# Patient Record
Sex: Female | Born: 1940 | Race: White | Hispanic: No | Marital: Single | Smoking: Former smoker
Health system: Southern US, Community
[De-identification: ages and names within clinical notes are randomized; demographics above are authoritative.]

## PROBLEM LIST (undated history)

## (undated) DIAGNOSIS — G309 Alzheimer's disease, unspecified: Secondary | ICD-10-CM

## (undated) DIAGNOSIS — Z1211 Encounter for screening for malignant neoplasm of colon: Secondary | ICD-10-CM

## (undated) DIAGNOSIS — R933 Abnormal findings on diagnostic imaging of other parts of digestive tract: Secondary | ICD-10-CM

## (undated) DIAGNOSIS — F209 Schizophrenia, unspecified: Secondary | ICD-10-CM

## (undated) DIAGNOSIS — J9601 Acute respiratory failure with hypoxia: Secondary | ICD-10-CM

## (undated) DIAGNOSIS — F0280 Dementia in other diseases classified elsewhere without behavioral disturbance: Secondary | ICD-10-CM

## (undated) DIAGNOSIS — Z860100 Personal history of colon polyps, unspecified: Secondary | ICD-10-CM

## (undated) DIAGNOSIS — N390 Urinary tract infection, site not specified: Secondary | ICD-10-CM

## (undated) DIAGNOSIS — K5730 Diverticulosis of large intestine without perforation or abscess without bleeding: Secondary | ICD-10-CM

## (undated) DIAGNOSIS — F429 Obsessive-compulsive disorder, unspecified: Secondary | ICD-10-CM

## (undated) DIAGNOSIS — J189 Pneumonia, unspecified organism: Secondary | ICD-10-CM

## (undated) DIAGNOSIS — S7290XA Unspecified fracture of unspecified femur, initial encounter for closed fracture: Secondary | ICD-10-CM

## (undated) DIAGNOSIS — D649 Anemia, unspecified: Secondary | ICD-10-CM

## (undated) DIAGNOSIS — E785 Hyperlipidemia, unspecified: Secondary | ICD-10-CM

## (undated) DIAGNOSIS — F331 Major depressive disorder, recurrent, moderate: Secondary | ICD-10-CM

## (undated) DIAGNOSIS — R531 Weakness: Secondary | ICD-10-CM

## (undated) DIAGNOSIS — K219 Gastro-esophageal reflux disease without esophagitis: Secondary | ICD-10-CM

## (undated) DIAGNOSIS — E039 Hypothyroidism, unspecified: Secondary | ICD-10-CM

## (undated) DIAGNOSIS — K623 Rectal prolapse: Secondary | ICD-10-CM

## (undated) DIAGNOSIS — R635 Abnormal weight gain: Secondary | ICD-10-CM

## (undated) DIAGNOSIS — R32 Unspecified urinary incontinence: Secondary | ICD-10-CM

## (undated) DIAGNOSIS — E079 Disorder of thyroid, unspecified: Secondary | ICD-10-CM

## (undated) HISTORY — DX: Major depressive disorder, recurrent, moderate: F33.1

## (undated) HISTORY — DX: Encounter for screening for malignant neoplasm of colon: Z12.11

## (undated) HISTORY — DX: Anemia, unspecified: D64.9

## (undated) HISTORY — PX: JOINT REPLACEMENT: SHX530

## (undated) HISTORY — PX: HEMIARTHROPLASTY HIP: SUR652

## (undated) HISTORY — DX: Disorder of thyroid, unspecified: E07.9

## (undated) HISTORY — DX: Personal history of colon polyps, unspecified: Z86.0100

## (undated) HISTORY — DX: Alzheimer's disease, unspecified: G30.9

## (undated) HISTORY — DX: Rectal prolapse: K62.3

## (undated) HISTORY — DX: Pneumonia, unspecified organism: J18.9

## (undated) HISTORY — DX: Schizophrenia, unspecified: F20.9

## (undated) HISTORY — DX: Abnormal findings on diagnostic imaging of other parts of digestive tract: R93.3

## (undated) HISTORY — PX: OTHER SURGICAL HISTORY: SHX169

## (undated) HISTORY — DX: Hyperlipidemia, unspecified: E78.5

## (undated) HISTORY — DX: Acute respiratory failure with hypoxia: J96.01

## (undated) HISTORY — DX: Dementia in other diseases classified elsewhere without behavioral disturbance: F02.80

## (undated) HISTORY — DX: Weakness: R53.1

## (undated) HISTORY — DX: Dementia in other diseases classified elsewhere, unspecified severity, without behavioral disturbance, psychotic disturbance, mood disturbance, and anxiety: F02.80

## (undated) HISTORY — DX: Personal history of colonic polyps: Z86.010

## (undated) HISTORY — DX: Unspecified fracture of unspecified femur, initial encounter for closed fracture: S72.90XA

## (undated) HISTORY — DX: Abnormal weight gain: R63.5

## (undated) HISTORY — DX: Obsessive-compulsive disorder, unspecified: F42.9

## (undated) HISTORY — PX: ORIF FEMUR FRACTURE- LISS PLATE: SHX2120

## (undated) HISTORY — DX: Diverticulosis of large intestine without perforation or abscess without bleeding: K57.30

## (undated) HISTORY — DX: Hypothyroidism, unspecified: E03.9

## (undated) HISTORY — DX: Gastro-esophageal reflux disease without esophagitis: K21.9

## (undated) HISTORY — DX: Unspecified urinary incontinence: R32

## (undated) HISTORY — DX: Urinary tract infection, site not specified: N39.0

---

## 1998-06-03 ENCOUNTER — Emergency Department (HOSPITAL_COMMUNITY): Admission: EM | Admit: 1998-06-03 | Discharge: 1998-06-03 | Payer: Self-pay | Admitting: Emergency Medicine

## 1998-10-31 ENCOUNTER — Emergency Department (HOSPITAL_COMMUNITY): Admission: EM | Admit: 1998-10-31 | Discharge: 1998-10-31 | Payer: Self-pay | Admitting: Emergency Medicine

## 2004-07-01 ENCOUNTER — Encounter: Admission: RE | Admit: 2004-07-01 | Discharge: 2004-07-01 | Payer: Self-pay | Admitting: Internal Medicine

## 2005-08-13 ENCOUNTER — Encounter: Admission: RE | Admit: 2005-08-13 | Discharge: 2005-08-13 | Payer: Self-pay | Admitting: *Deleted

## 2006-06-20 ENCOUNTER — Emergency Department (HOSPITAL_COMMUNITY): Admission: EM | Admit: 2006-06-20 | Discharge: 2006-06-20 | Payer: Self-pay | Admitting: Emergency Medicine

## 2006-08-19 ENCOUNTER — Encounter: Admission: RE | Admit: 2006-08-19 | Discharge: 2006-08-19 | Payer: Self-pay | Admitting: Internal Medicine

## 2006-09-03 ENCOUNTER — Encounter: Admission: RE | Admit: 2006-09-03 | Discharge: 2006-09-03 | Payer: Self-pay | Admitting: Obstetrics and Gynecology

## 2006-12-01 ENCOUNTER — Inpatient Hospital Stay (HOSPITAL_COMMUNITY): Admission: EM | Admit: 2006-12-01 | Discharge: 2006-12-10 | Payer: Self-pay | Admitting: Emergency Medicine

## 2006-12-01 ENCOUNTER — Ambulatory Visit: Payer: Self-pay | Admitting: Internal Medicine

## 2006-12-08 ENCOUNTER — Encounter (INDEPENDENT_AMBULATORY_CARE_PROVIDER_SITE_OTHER): Payer: Self-pay | Admitting: *Deleted

## 2007-03-08 ENCOUNTER — Encounter (INDEPENDENT_AMBULATORY_CARE_PROVIDER_SITE_OTHER): Payer: Self-pay | Admitting: Obstetrics and Gynecology

## 2007-03-08 ENCOUNTER — Ambulatory Visit (HOSPITAL_COMMUNITY): Admission: RE | Admit: 2007-03-08 | Discharge: 2007-03-08 | Payer: Self-pay | Admitting: Obstetrics and Gynecology

## 2007-07-15 DIAGNOSIS — S7290XA Unspecified fracture of unspecified femur, initial encounter for closed fracture: Secondary | ICD-10-CM

## 2007-07-15 HISTORY — DX: Unspecified fracture of unspecified femur, initial encounter for closed fracture: S72.90XA

## 2007-09-24 ENCOUNTER — Inpatient Hospital Stay (HOSPITAL_COMMUNITY): Admission: EM | Admit: 2007-09-24 | Discharge: 2007-09-29 | Payer: Self-pay | Admitting: Emergency Medicine

## 2007-09-26 ENCOUNTER — Encounter (INDEPENDENT_AMBULATORY_CARE_PROVIDER_SITE_OTHER): Payer: Self-pay | Admitting: *Deleted

## 2007-10-29 ENCOUNTER — Encounter (HOSPITAL_BASED_OUTPATIENT_CLINIC_OR_DEPARTMENT_OTHER): Admission: RE | Admit: 2007-10-29 | Discharge: 2008-01-27 | Payer: Self-pay | Admitting: Surgery

## 2008-11-28 ENCOUNTER — Inpatient Hospital Stay (HOSPITAL_COMMUNITY): Admission: EM | Admit: 2008-11-28 | Discharge: 2008-12-05 | Payer: Self-pay | Admitting: Emergency Medicine

## 2008-11-28 ENCOUNTER — Ambulatory Visit: Payer: Self-pay | Admitting: Internal Medicine

## 2008-12-01 ENCOUNTER — Encounter (INDEPENDENT_AMBULATORY_CARE_PROVIDER_SITE_OTHER): Payer: Self-pay | Admitting: Internal Medicine

## 2009-10-02 ENCOUNTER — Encounter: Admission: RE | Admit: 2009-10-02 | Discharge: 2009-10-02 | Payer: Self-pay | Admitting: Geriatric Medicine

## 2009-11-16 ENCOUNTER — Emergency Department (HOSPITAL_COMMUNITY): Admission: EM | Admit: 2009-11-16 | Discharge: 2009-11-16 | Payer: Self-pay | Admitting: Emergency Medicine

## 2010-02-19 ENCOUNTER — Encounter: Payer: Self-pay | Admitting: Internal Medicine

## 2010-03-14 ENCOUNTER — Encounter (INDEPENDENT_AMBULATORY_CARE_PROVIDER_SITE_OTHER): Payer: Self-pay | Admitting: *Deleted

## 2010-04-08 ENCOUNTER — Encounter: Payer: Self-pay | Admitting: Internal Medicine

## 2010-05-06 DIAGNOSIS — F0280 Dementia in other diseases classified elsewhere without behavioral disturbance: Secondary | ICD-10-CM

## 2010-05-06 DIAGNOSIS — D649 Anemia, unspecified: Secondary | ICD-10-CM | POA: Insufficient documentation

## 2010-05-06 DIAGNOSIS — M810 Age-related osteoporosis without current pathological fracture: Secondary | ICD-10-CM | POA: Insufficient documentation

## 2010-05-06 DIAGNOSIS — G309 Alzheimer's disease, unspecified: Secondary | ICD-10-CM

## 2010-05-06 DIAGNOSIS — F331 Major depressive disorder, recurrent, moderate: Secondary | ICD-10-CM | POA: Insufficient documentation

## 2010-05-06 DIAGNOSIS — F209 Schizophrenia, unspecified: Secondary | ICD-10-CM

## 2010-05-06 DIAGNOSIS — K219 Gastro-esophageal reflux disease without esophagitis: Secondary | ICD-10-CM | POA: Insufficient documentation

## 2010-05-06 DIAGNOSIS — F429 Obsessive-compulsive disorder, unspecified: Secondary | ICD-10-CM | POA: Insufficient documentation

## 2010-05-06 HISTORY — DX: Dementia in other diseases classified elsewhere, unspecified severity, without behavioral disturbance, psychotic disturbance, mood disturbance, and anxiety: F02.80

## 2010-05-06 HISTORY — DX: Schizophrenia, unspecified: F20.9

## 2010-05-10 ENCOUNTER — Ambulatory Visit: Payer: Self-pay | Admitting: Internal Medicine

## 2010-05-27 ENCOUNTER — Telehealth: Payer: Self-pay | Admitting: Internal Medicine

## 2010-05-28 ENCOUNTER — Ambulatory Visit: Payer: Self-pay | Admitting: Internal Medicine

## 2010-05-28 ENCOUNTER — Inpatient Hospital Stay (HOSPITAL_COMMUNITY)
Admission: AD | Admit: 2010-05-28 | Discharge: 2010-05-30 | Payer: Self-pay | Source: Home / Self Care | Admitting: Internal Medicine

## 2010-05-29 ENCOUNTER — Encounter: Payer: Self-pay | Admitting: Internal Medicine

## 2010-05-30 ENCOUNTER — Encounter: Payer: Self-pay | Admitting: Internal Medicine

## 2010-05-31 ENCOUNTER — Telehealth (INDEPENDENT_AMBULATORY_CARE_PROVIDER_SITE_OTHER): Payer: Self-pay | Admitting: *Deleted

## 2010-05-31 ENCOUNTER — Telehealth: Payer: Self-pay | Admitting: Internal Medicine

## 2010-06-16 IMAGING — CR DG CHEST 2V
1 series · 1 of 1 positions shown · non-contrast
Comparison: Chest x-ray of 10/02/2009

CLINICAL DATA: Fell with left leg pain, shortness of breath, former
smoker

CHEST - 2 VIEW

[view not recorded]
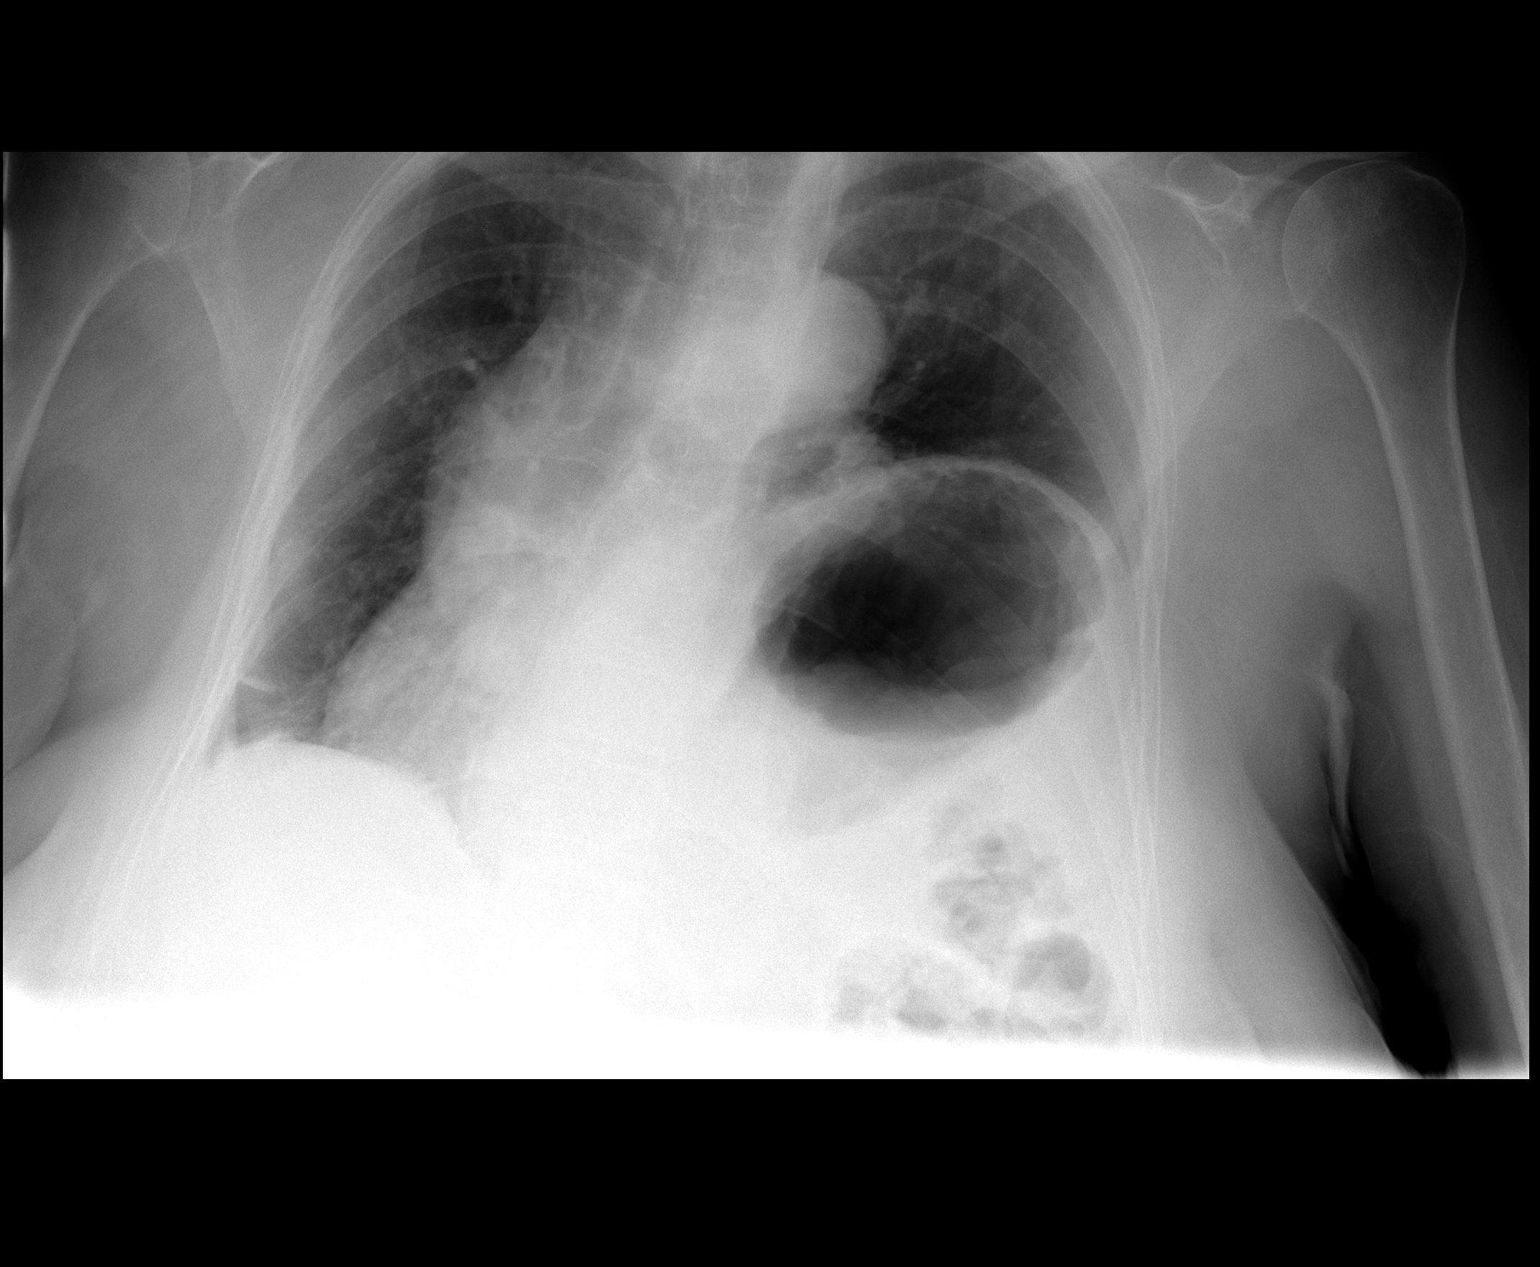

[1 of 1 positions shown; findings below may reference images not displayed]

FINDINGS: There is little change in chronic elevation of the left
hemidiaphragm.  Otherwise the lungs are clear.  Cardiomegaly is
stable.  No acute bony abnormality is seen.
IMPRESSION: Stable chest x-ray with chronic elevation of the left
hemidiaphragm and cardiomegaly.  No active process.

## 2010-07-16 ENCOUNTER — Ambulatory Visit
Admission: RE | Admit: 2010-07-16 | Discharge: 2010-07-16 | Payer: Self-pay | Source: Home / Self Care | Attending: Internal Medicine | Admitting: Internal Medicine

## 2010-07-16 DIAGNOSIS — E039 Hypothyroidism, unspecified: Secondary | ICD-10-CM

## 2010-07-16 DIAGNOSIS — Z860100 Personal history of colon polyps, unspecified: Secondary | ICD-10-CM | POA: Insufficient documentation

## 2010-07-16 DIAGNOSIS — K5730 Diverticulosis of large intestine without perforation or abscess without bleeding: Secondary | ICD-10-CM | POA: Insufficient documentation

## 2010-07-16 HISTORY — DX: Hypothyroidism, unspecified: E03.9

## 2010-07-17 ENCOUNTER — Encounter (INDEPENDENT_AMBULATORY_CARE_PROVIDER_SITE_OTHER): Payer: Self-pay | Admitting: *Deleted

## 2010-07-17 DIAGNOSIS — R933 Abnormal findings on diagnostic imaging of other parts of digestive tract: Secondary | ICD-10-CM

## 2010-07-17 HISTORY — DX: Abnormal findings on diagnostic imaging of other parts of digestive tract: R93.3

## 2010-08-04 ENCOUNTER — Encounter: Payer: Self-pay | Admitting: Obstetrics and Gynecology

## 2010-08-13 NOTE — Procedures (Signed)
Summary: Colonoscopy  Patient: Crystal Braun Note: All result statuses are Final unless otherwise noted.  Tests: (1) Colonoscopy (COL)   COL Colonoscopy           DONE     [facility]     [address] [place], [place]  [address]           COLONOSCOPY PROCEDURE REPORT           PATIENT:  [name], [name]  MR#:  [id number]     BIRTHDATE:  01-11-1941, 69 yrs. old  GENDER:  female     ENDOSCOPIST:  Hedwig Morton. Juanda Chance, MD     REF. BY:  Florentina Jenny, M.D.     PROCEDURE DATE:  05/29/2010     PROCEDURE:  Colonoscopy with biopsy and snare polypectomy     ASA CLASS:  Class III     INDICATIONS:  change in bowel habits, constipation     MEDICATIONS:   Versed 5 mg, Fentanyl 75 mcg           DESCRIPTION OF PROCEDURE:   After the risks benefits and     alternatives of the procedure were thoroughly explained, informed     consent was obtained.  Digital rectal exam was performed and     revealed no abnormalities.   The Pentax Ped Colon [id number]     endoscope was introduced through the anus and advanced to the     cecum, which was identified by both the appendix and ileocecal     valve, without limitations.  The quality of the prep was     excellent, using MoviPrep.  The instrument was then slowly     withdrawn as the colon was fully examined.     <<PROCEDUREIMAGES>>           FINDINGS:  There were multiple polyps identified and removed.     large multilobulated carpeted polyp in the cecum about 3 cm in     diameter, 4 polyps 5-15 mm at 70-8-cm, 2 polypd at right colon The     polyps were removed using cold biopsy forceps. Polyps were snared,     then cauterized with monopolar cautery. Retrieval was successful.     snare polyp Polyps were snared without cautery. Retrieval was     successful (see image2, image1, image5, image6, image7, image8,     and image9). snare polyp  Mild diverticulosis was found in the     sigmoid to descending colon segments (see image1).  This was  otherwise a normal examination of the colon (see image3, image4,     and image10).   Retroflexed views in the rectum revealed no     abnormalities.    The scope was then withdrawn from the patient     and the procedure completed.           COMPLICATIONS:  None     ENDOSCOPIC IMPRESSION:     1) Polyps, multiple     2) Mild diverticulosis in the sigmoid to descending colon     segments     3) Otherwise normal examination     large multilobulated polyp removed piecemeal from the cecum, r/o     dysplasia/carcinoma     RECOMMENDATIONS:     1) Await pathology results     CT scan of the abd and pelvis, CEA, see orders     REPEAT EXAM:  In 1 year(s) for.  ______________________________     Hedwig Morton. Juanda Chance, MD           CC:           n.     eSIGNED:   Hedwig Morton. Rylin Saez at 05/29/2010 11:17 AM           Fredda Hammed, [id number]  Note: An exclamation mark (!) indicates a result that was not dispersed into the flowsheet. Document Creation Date: 05/29/2010 11:18 AM _______________________________________________________________________  (1) Order result status: Final Collection or observation date-time: 05/29/2010 11:09 Requested date-time:  Receipt date-time:  Reported date-time:  Referring Physician:   Ordering Physician: Lina Sar [phone]) Specimen Source:  Source: Launa Grill Order Number: [id number] Lab site:

## 2010-08-13 NOTE — Progress Notes (Signed)
Summary: med ?  Phone Note Call from Patient Call back at Home Phone [phone]   Caller: sister, Jonny Ruiz Call For: Dr. Juanda Chance Reason for Call: Talk to Nurse Summary of Call: when is pt cleared to have asprin Initial call taken by: Vallarie Mare,  May 31, 2010 11:25 AM  Follow-up for Phone Call        Message left for patient to call back.Jesse Fall RN  May 31, 2010 11:41 AM Spoke with [name]. Told her that the hospital d/c states for patient to avoid Aspirin and Nonsteriodal antiinflammatories(Ibuprofren, Advil, Aleve, etc) She wanted to know what patient could take for pain in her broken leg. Suggested Tylenol. Follow-up by: Jesse Fall RN,  May 31, 2010 2:20 PM  Additional Follow-up for Phone Call Additional follow up Details #1::        reviewed and agree. Additional Follow-up by: Hart Carwin MD,  May 31, 2010 10:13 PM

## 2010-08-13 NOTE — Miscellaneous (Signed)
Summary: Visit/[facility]  [facility]   Imported By: Lester Brookridge 05/14/2010 08:54:47  _____________________________________________________________________  External Attachment:    Type:   Image     Comment:   External Document

## 2010-08-13 NOTE — Letter (Signed)
Summary: Letter to Dr Juanda Chance from [facility]  Letter to Dr Juanda Chance from [facility]   Imported By: Lamona Curl CMA (AAMA) 05/06/2010 15:31:02  _____________________________________________________________________  External Attachment:    Type:   Image     Comment:   External Document

## 2010-08-13 NOTE — Letter (Signed)
Summary: Patient Notice- Polyp Results  [facility]  [address] [place], [place] [address]   Phone: [phone]  Fax: [phone]        May 30, 2010 MRN: [id number]    Crystal Braun [address] [place], [place]  [address]    Dear 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  Should you develop new or worsening symptoms of abdominal pain, bowel habit changes or bleeding from the rectum or bowels, please schedule an evaluation with either your primary care physician or with me.  Additional information/recommendations:  __ No further action with gastroenterology is needed at this time. Please      follow-up with your primary care physician for your other healthcare      needs.  _x_ Please call [phone] to schedule a return visit to review your      situation.  __ Please keep your follow-up visit as already scheduled.  __ Continue treatment plan as outlined the day of your exam.  Please call us if you are having persistent problems or have questions about your condition that have not been fully answered at this time.  Sincerely,  Hart Carwin MD  This letter has been electronically signed by your physician.  Appended Document: Patient Notice- Polyp Results Letter mailed to patient's home.

## 2010-08-13 NOTE — Assessment & Plan Note (Signed)
Summary: EVALUATE FOR COLON/YF   History of Present Illness Visit Type: consult  Primary GI MD: Lina Sar MD Primary Provider: Florentina Jenny, MD  Requesting Provider: Florentina Jenny, MD  Chief Complaint: Constipation and diarrhea  History of Present Illness:   This is a 70 year old white female with Alzheimers disease living in an assisted living situation. She is here today for evaluation of irregular bowel habits including diarrhea alternating with constipation. She is brought by her sister and a Comptroller. who give pertinent history. Patient has not been able to take care of her personal needs. It has been noted that she had a fecal impaction about 3 months ago requiring manual disimpaction. She has been increasingly sedentary only occasionally ambulating and this has contributed to the constipation. Besides that, she has been on sertraline and Seroquel. There was no blood noted per rectum. Her weight has been steadily increasing. Her appetite has been good and there has been no nausea, vomiting or abdominal pain. She has been on a laxative regimen includes fiber tablets daily, Senokot 2 tablets at bedtime, and MiraLax 17 g daily which was discontinued 3 weeks ago. She is now on MiraLax p.r.n. but really has not taken any in the last 3 weeks. There is no family history of colon cancer. She has never had a colonoscopy.   GI Review of Systems      Denies abdominal pain, acid reflux, belching, bloating, chest pain, dysphagia with liquids, dysphagia with solids, heartburn, loss of appetite, nausea, vomiting, vomiting blood, weight loss, and  weight gain.      Reports constipation and  diarrhea.     Denies anal fissure, black tarry stools, change in bowel habit, diverticulosis, fecal incontinence, heme positive stool, hemorrhoids, irritable bowel syndrome, jaundice, light color stool, liver problems, rectal bleeding, and  rectal pain.    Current Medications (verified): 1)  Fosamax 70 Mg Tabs  (Alendronate Sodium) .... Take 1 Tablet By Mouth Once Per Week 2)  Just Tears Eye Drops  Soln (Artificial Tear Solution) .... Instill 1 Drop in Both Eyes Three Times A Day 3)  Aspirin 81 Mg Tbec (Aspirin) .... Take 1 Tablet By Mouth Once Daily 4)  Cranberry 250 Mg Caps (Cranberry) .... Take 1 Capsule By Mouth Two Times A Day 5)  Hydrocortisone 1 % Crea (Hydrocortisone) .... Apply To Face Two Times A Day 6)  Imodium A-D 2 Mg Tabs (Loperamide Hcl) .... Take 1 Capsule By Mouth As Directed 7)  Namenda 10 Mg Tabs (Memantine Hcl) .... Take 1 Tablet By Mouth Two Times A Day 8)  Nystatin  Powd (Nystatin) .... Apply Two Times A Day To Vaginal Area Until Symptoms Resolve, Then Apply As Needed 9)  Protonix 40 Mg Solr (Pantoprazole Sodium) .... Take 1 Tablet By Mouth Once A Day 10)  Psyllium 500 Mg Capsule .... Take 1 Capsule By Mouth At Bedtime With Water 11)  Senna 8.6 Mg Tabs (Sennosides) .... Take 2 Tablets By Mouth At Bedtime 12)  Seroquel 50 Mg Tabs (Quetiapine Fumarate) .... Take 1 Tablet By Mouth Once Daily 13)  Sertraline Hcl 50 Mg Tabs (Sertraline Hcl) .... Take 1 Tablet By Mouth Once A Day 14)  Tylenol Pm Extra Strength 500-25 Mg Tabs (Diphenhydramine-Apap (Sleep)) .... Take 1 Tablet By Mouth Every 8 Hours As Needed 15)  Zinc Oxide 20 % Oint (Zinc Oxide) .... Apply Topically After Each Loose Stool 16)  Vitamin D 1000 Unit Tabs (Cholecalciferol) .... One Tablet By Mouth Once Daily  Allergies (verified):  1)  ! 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 Loupe electrical surgical excision of the cervix Left Romilda Joy hemiarthroplasty, left hip Open reduction and internal  fixation of femur fracture with a plate up around the periprosthetic stem.  Family History: Reviewed history from 05/06/2010 and no changes required. 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  Patient is a former smoker -stopped in 1998 Illicit Drug Use - no Patient does not get regular exercise.   Review of Systems  The patient denies allergy/sinus, anemia, anxiety-new, arthritis/joint pain, back pain, blood in urine, breast changes/lumps, change in vision, confusion, cough, coughing up blood, depression-new, fainting, fatigue, fever, headaches-new, hearing problems, heart murmur, heart rhythm changes, itching, menstrual pain, muscle pains/cramps, night sweats, nosebleeds, pregnancy symptoms, shortness of breath, skin rash, sleeping problems, sore throat, swelling of feet/legs, swollen lymph glands, thirst - excessive , urination - excessive , urination changes/pain, urine leakage, vision changes, and voice change.         Pertinent positive and negative review of systems were noted in the above HPI. All other ROS was otherwise negative.   Vital Signs:  Patient profile:   70 year old female Height:      68 inches Weight:      189 pounds BMI:     28.84 BSA:     2.00 Pulse rate:   62 / minute Pulse rhythm:   regular BP sitting:   132 / 64  (left arm) Cuff size:   regular  Vitals Entered By: Ok Anis CMA (May 10, 2010 1:25 PM)  Physical Exam  General:  alert but not oriented. Sitting in a wheelchair. We helped her from the wheelchair up to the examining table. She has minimal cooperation. Eyes:  nonicteric. Neck:  no bruits. Lungs:  Clear throughout to auscultation. Heart:  Regular rate and rhythm;  no murmurs, rubs,  or bruits. Abdomen:  soft slightly protuberant abdomen with normoactive bowel sounds. No tenderness, no palpable mass or stool. Rectal:  normal rectal sphincter tone with a moderate amount of soft Hemoccult negative stool. Extremities:  trace edema bilaterally. Skin:  dry skin. Psych:  does not answer any questions but occasionally says sentences.   Impression & Recommendations:  Problem # 1:  ALZHEIMER'S DISEASE (ICD-331.0) Patient has limited mental capacity. She requires care of personal hygiene by other personnel and would be unable to prep for a colonoscopy at home. She would also be unable to give permission for procedures. her sister is interested in pursuing the colonoscopy.  Problem # 2:  SPECIAL SCREENING FOR MALIGNANT NEOPLASMS COLON (ICD-V76.51)  Patient has variable bowel habits including both diarrhea and constipation. I suspect she has more constipation with overflow diarrhea. There is no evidence of upper GI blood loss. She is mildly anemic. We will schedule her for a colonoscopy since she has never had one. We need to rule out an obstructing lesion. She will have to be prepped in the hospital setting.  Orders: Colonoscopy (Colon)  Problem # 3:  ALZHEIMER'S DISEASE (ICD-331.0) alert but not oriented.  Patient Instructions: 1)  You have been scheduled for a colonoscopy on Wednesday 05/29/10 @ 9 am at [facility]. 2)  You will go to the hospital for Bowel Prep/Observation @ [facility] on 05/28/10 by noon. Either Bed Placement or our office will call to inform you when a bed has been made available for you. 3)  Please DO NOT have any solid foods, milk or milk products on 05/28/10 (the day before the test). You may have CLEAR liquids that day though. 4)  Please call our office at [phone] for any questions. You may ask to speak to [facility] 5)  Copy sent to : Florentina Jenny, MD 6)  The medication list was reviewed and reconciled.  All  changed / newly prescribed medications were explained.  A complete medication list was provided to the patient / caregiver.

## 2010-08-13 NOTE — Letter (Signed)
Summary: New Patient letter  [facility]  [address] [place], [place] [address]   Phone: [phone]  Fax: [phone]       02/19/2010 MRN: [id number]  Crystal Braun [address] [place], [place]  [address]  Dear [name],  Welcome to the Gastroenterology Division at [facility].    You are scheduled to see Dr.  Juanda Chance on 05-10-10 at 1:30pm on the 3rd floor at [facility], [address]  We ask that you try to arrive at our office 15 minutes prior to your appointment time to allow for check-in.  We would like you to complete the enclosed self-administered evaluation form prior to your visit and bring it with you on the day of your appointment.  We will review it with you.  Also, please bring a complete list of all your medications or, if you prefer, bring the medication bottles and we will list them.  Please bring your insurance card so that we may make a copy of it.  If your insurance requires a referral to see a specialist, please bring your referral form from your primary care physician.  Co-payments are due at the time of your visit and may be paid by cash, check or credit card.     Your office visit will consist of a consult with your physician (includes a physical exam), any laboratory testing he/she may order, scheduling of any necessary diagnostic testing (e.g. x-ray, ultrasound, CT-scan), and scheduling of a procedure (e.g. Endoscopy, Colonoscopy) if required.  Please allow enough time on your schedule to allow for any/all of these possibilities.    If you cannot keep your appointment, please call [phone] to cancel or reschedule prior to your appointment date.  This allows [place] the opportunity to schedule an appointment for another patient in need of care.  If you do not cancel or reschedule by 5 p.m. the business day prior to your appointment date, you will be charged a $50.00 late cancellation/no-show fee.    Thank you for choosing  [facility] for your medical needs.  We appreciate the opportunity to care for you.  Please visit [place] at our website  to learn more about our practice.                     Sincerely,                                                             The Gastroenterology Division

## 2010-08-13 NOTE — Progress Notes (Signed)
Summary: Reschedule appt  Phone Note Other Incoming   Caller: Dr Juanda Chance Summary of Call: Dr Juanda Chance called and would like pt to be seen by her on a day when Dr Christella Hartigan will be in the building.  Her appt was moved to 07-16-10.    Daughter [name] was notified at [phone] Initial call taken by: Chales Abrahams CMA Duncan Dull),  May 31, 2010 11:21 AM

## 2010-08-13 NOTE — Progress Notes (Signed)
Summary: Procedure Question  Phone Note Other Incoming Call back at Home Phone [phone]   Caller: Pt's sister, Haskell Riling Details for Reason: ? about procedure Summary of Call: Ms. Amalia Hailey, pt's sister, was of the understanding that patient was to be admitted into the hospital on Tuesday to do her prep for her procedure scheduled on Wednesday.  She said the hospital did not seem to be aware of that.  Please call asap and advise. Initial call taken by: Schuyler Amor,  May 27, 2010 10:46 AM  Follow-up for Phone Call        Patient's sister says Judie Grieve said that the hospital was unaware that patient was to be admitted the day prior to her procedure for prep. I actually spoke with Clydie Braun on 10-28 and told her at that time that patient would be admitted for prep. I have advised [name] that we will take care of everything and I will call her on Tuesday AM after a bed has been scheduled for patient. Follow-up by: Lamona Curl CMA Duncan Dull),  May 27, 2010 11:17 AM

## 2010-08-13 NOTE — Letter (Signed)
Summary: [facility]  [facility]   Imported By: Lamona Curl CMA (AAMA) 05/06/2010 15:32:25  _____________________________________________________________________  External Attachment:    Type:   Image     Comment:   External Document

## 2010-08-15 ENCOUNTER — Encounter: Payer: Self-pay | Admitting: Gastroenterology

## 2010-08-15 ENCOUNTER — Telehealth (INDEPENDENT_AMBULATORY_CARE_PROVIDER_SITE_OTHER): Payer: Self-pay | Admitting: *Deleted

## 2010-08-15 ENCOUNTER — Encounter: Payer: Medicare Other | Admitting: Gastroenterology

## 2010-08-15 ENCOUNTER — Ambulatory Visit (HOSPITAL_COMMUNITY)
Admission: RE | Admit: 2010-08-15 | Discharge: 2010-08-15 | Disposition: A | Discharge status: Home / Self Care | Payer: Medicare Other | Source: Ambulatory Visit | Attending: Gastroenterology | Admitting: Gastroenterology

## 2010-08-15 DIAGNOSIS — K2210 Ulcer of esophagus without bleeding: Secondary | ICD-10-CM

## 2010-08-15 DIAGNOSIS — K862 Cyst of pancreas: Secondary | ICD-10-CM | POA: Insufficient documentation

## 2010-08-15 DIAGNOSIS — K219 Gastro-esophageal reflux disease without esophagitis: Secondary | ICD-10-CM | POA: Insufficient documentation

## 2010-08-15 DIAGNOSIS — R933 Abnormal findings on diagnostic imaging of other parts of digestive tract: Secondary | ICD-10-CM

## 2010-08-15 DIAGNOSIS — F259 Schizoaffective disorder, unspecified: Secondary | ICD-10-CM | POA: Insufficient documentation

## 2010-08-15 DIAGNOSIS — Q409 Congenital malformation of upper alimentary tract, unspecified: Secondary | ICD-10-CM | POA: Insufficient documentation

## 2010-08-15 NOTE — Letter (Signed)
Summary: EGD Instructions  [facility]  [address] [place], [place] [address]   Phone: [phone]  Fax: [phone]       Crystal Braun    1941-01-16    MRN: [id number]       Procedure Day /Date: 08/15/10  THURS     Arrival Time: 730 am     Procedure Time:930 am     Location of Procedure:                     [facility] ( Outpatient Registration)    PREPARATION FOR ENDOSCOPY   On 08/15/10  THE DAY OF THE PROCEDURE:  Nothing to eat or drink after midnight  MEDICATION INSTRUCTIONS  Unless otherwise instructed, you should take regular prescription medications with a small sip of water as early as possible the morning of your procedure.               OTHER INSTRUCTIONS  You will need a responsible adult at least 72 years of age to accompany you and drive you home.   This person must remain in the waiting room during your procedure.  Wear loose fitting clothing that is easily removed.  Leave jewelry and other valuables at home.  However, you may wish to bring a book to read or an iPod/MP3 player to listen to music as you wait for your procedure to start.  Remove all body piercing jewelry and leave at home.  Total time from sign-in until discharge is approximately 2-3 hours.  You should go home directly after your procedure and rest.  You can resume normal activities the day after your procedure.  The day of your procedure you should not:   Drive   Make legal decisions   Operate machinery   Drink alcohol   Return to work  You will receive specific instructions about eating, activities and medications before you leave.    The above instructions have been reviewed and explained to me by   Chales Abrahams CMA Duncan Dull)  July 17, 2010 9:31 AM     I fully understand and can verbalize these instructions over the phone and mailed to the home of  Crystal Braun Date 07/17/10

## 2010-08-15 NOTE — Assessment & Plan Note (Signed)
Summary: post colonscopy f/u/ large cecal polyp/all   History of Present Illness Visit Type: Follow-up Visit Primary GI MD: Lina Sar MD Primary Provider: Florentina Jenny, MD  Requesting Provider: na Chief Complaint: F/u from colon History of Present Illness:   Pt became restless while waiting for her appointment so that her  nurse had to take her back to waiting room while I was  discussing her case of pncreatic lesion with her sister Haskell Riling, who has herPOA. I have also discussed 14 mm cystic lesion ib the body of the pancreas with Dr Christella Hartigan who recommends for the lesion to be biopsied by EUS. It has been arranged by Dr Larae Grooms nurse.   GI Review of Systems      Denies abdominal pain, acid reflux, belching, bloating, chest pain, dysphagia with liquids, dysphagia with solids, heartburn, loss of appetite, nausea, vomiting, vomiting blood, weight loss, and  weight gain.        Denies anal fissure, black tarry stools, change in bowel habit, constipation, diarrhea, diverticulosis, fecal incontinence, heme positive stool, hemorrhoids, irritable bowel syndrome, jaundice, light color stool, liver problems, rectal bleeding, and  rectal pain.    Current Medications (verified): 1)  Fosamax 70 Mg Tabs (Alendronate Sodium) .... Take 1 Tablet By Mouth Once Per Week 2)  Just Tears Eye Drops  Soln (Artificial Tear Solution) .... Instill 1 Drop in Both Eyes Three Times A Day 3)  Aspirin 81 Mg Tbec (Aspirin) .... Take 1 Tablet By Mouth Once Daily 4)  Cranberry 250 Mg Caps (Cranberry) .... Take 1 Capsule By Mouth Two Times A Day 5)  Hydrocortisone 1 % Crea (Hydrocortisone) .... Apply To Face Two Times A Day 6)  Imodium A-D 2 Mg Tabs (Loperamide Hcl) .... Take 1 Capsule By Mouth As Directed 7)  Namenda 10 Mg Tabs (Memantine Hcl) .... Take 1 Tablet By Mouth Two Times A Day 8)  Nystatin  Powd (Nystatin) .... Apply Two Times A Day To Vaginal Area Until Symptoms Resolve, Then Apply As Needed 9)   Protonix 40 Mg Solr (Pantoprazole Sodium) .... Take 1 Tablet By Mouth Once A Day 10)  Psyllium 500 Mg Capsule .... Take 1 Capsule By Mouth At Bedtime With Water 11)  Senna 8.6 Mg Tabs (Sennosides) .... Take 2 Tablets By Mouth At Bedtime 12)  Seroquel 50 Mg Tabs (Quetiapine Fumarate) .... Take 1 Tablet By Mouth Once Daily 13)  Sertraline Hcl 50 Mg Tabs (Sertraline Hcl) .... Take 1 Tablet By Mouth Once A Day 14)  Tylenol Pm Extra Strength 500-25 Mg Tabs (Diphenhydramine-Apap (Sleep)) .... Take 1 Tablet By Mouth Every 8 Hours As Needed 15)  Zinc Oxide 20 % Oint (Zinc Oxide) .... Apply Topically After Each Loose Stool 16)  Vitamin D 1000 Unit Tabs (Cholecalciferol) .... One Tablet By Mouth Once Daily  Allergies (verified): 1)  ! 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 Loupe electrical surgical excision of the cervix Left Romilda Joy hemiarthroplasty, left hip Open reduction and internal fixation of femur fracture with a plate up around the periprosthetic stem.  Family History: Reviewed history from 05/06/2010 and no changes required. Family History of Breast Cancer: Mother No FH of Colon Cancer: Family History  of Liver Cancer: Father  Social History: Reviewed history from 05/10/2010 and no changes required. 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  Patient is a  former smoker -stopped in 1998 Illicit Drug Use - no Patient does not get regular exercise.   Vital Signs:  Patient profile:   70 year old female Height:      68 inches Weight:      162 pounds BMI:     24.72 BSA:     1.87 Pulse rate:   62 / minute Pulse rhythm:   regular BP sitting:   124 / 60  (left arm) Cuff size:   regular  Vitals Entered By: Ok Anis CMA (July 16, 2010 4:05 PM)   Patient Instructions: 1)  Please call Chales Abrahams, CMA for Dr Rob Bunting to set up an endoscopic ultrasound (as was discussed at your visit today)recall colon 1 year 2)  Copy sent to : Dr Rexene Edison.Tripp 3)  The medication list was reviewed and reconciled.  All changed / newly prescribed medications were explained.  A complete medication list was provided to the patient / caregiver.

## 2010-08-16 NOTE — Letter (Signed)
Summary: Speech Pathology Report  Speech Pathology Report   Imported By: Lamona Curl CMA (AAMA) 05/06/2010 17:02:02  _____________________________________________________________________  External Attachment:    Type:   Image     Comment:   External Document

## 2010-08-16 NOTE — Letter (Signed)
Summary: Modified Barium Swallow  Modified Barium Swallow   Imported By: Lamona Curl CMA (AAMA) 05/07/2010 10:02:15  _____________________________________________________________________  External Attachment:    Type:   Image     Comment:   External Document

## 2010-08-21 NOTE — Progress Notes (Signed)
----   Converted from flag ---- ---- 08/15/2010 11:12 AM, Hart Carwin MD wrote: Crystal Braun, could You, please, make an appointment for the pt in 6 weeks? her sister [name] is in charge of her schedule.On last time she got upset because she had to wait. It is aparently a big deal to bring her to the doctor and pt is very impatient so they had to calm her down. her sister may just come by herself to talk to me about her.  ---- 08/15/2010 10:59 AM, Rachael Fee MD wrote: 6 weeks sounds good.  ---- 08/15/2010 10:58 AM, Hart Carwin MD wrote: [name], interesting findings.I hope her sister [name] was friendly when You talked to her. May be follow up visit in 6 weeks? ------------------------------       Additional Follow-up for Phone Call Additional follow up Details #2::    Spoke with [name]. She will call back when she gets to her office to schedule appointment. Jesse Fall RN  August 15, 2010 3:35 PM Spoke with Kathlee Nations and scheduled an appointment for her to come and discuss her sister's condition with Dr. Juanda Chance on 09/18/10 at 8:30 AM. Kathlee Nations states she is coming on 09/05/10 with a friend for a procedure with Dr. Juanda Chance and was wondering if she could talk then. She understands if this isn't possible and will keep the appointment. Please, advise. Follow-up by: Jesse Fall RN,  August 15, 2010 4:07 PM  I will be happy to talk to her about her sister at that [place]. DB  Appended Document:  Left message for [name] to call me.   Appended Document:  [name] aware that Dr. Juanda Chance with talk with her on 09/05/10. Cancelled appt. on 09/18/10

## 2010-08-21 NOTE — Miscellaneous (Signed)
Summary: rx  Clinical Lists Changes  Medications: Changed medication from PROTONIX 40 MG SOLR (PANTOPRAZOLE SODIUM) Take 1 tablet by mouth once a day to PROTONIX 40 MG SOLR (PANTOPRAZOLE SODIUM) take one pill three times a day, 20-30 min before meals - Signed Rx of PROTONIX 40 MG SOLR (PANTOPRAZOLE SODIUM) take one pill three times a day, 20-30 min before meals;  #90 x 3;  Signed;  Entered by: Rachael Fee MD;  Authorized by: Rachael Fee MD;  Method used: Print then Give to Patient    Prescriptions: PROTONIX 40 MG SOLR (PANTOPRAZOLE SODIUM) take one pill three times a day, 20-30 min before meals  #90 x 3   Entered and Authorized by:   Rachael Fee MD   Signed by:   Rachael Fee MD on 08/15/2010   Method used:   Print then Give to Patient   RxID:   [id number]   Appended Document: rx Dr Christella Hartigan did you mean for this rx to say three times a day?  The pharmacy is calling to verify.  Appended Document: rx yes, she has severe ulcerative esophagitis  Appended Document: rx [facility] notified at [facility] [phone]

## 2010-08-21 NOTE — Procedures (Signed)
Summary: Endoscopic Ultrasound  Patient: Crystal Braun Note: All result statuses are Final unless otherwise noted.  Tests: (1) Endoscopic Ultrasound (EUS)  EUS Endoscopic Ultrasound                             DONE     [facility]     [address] [place], [place]  [address]           ENDOSCOPIC ULTRASOUND PROCEDURE REPORT     PATIENT:  Crystal, Braun  MR#:  [id number]     BIRTHDATE:  16-Dec-1940  GENDER:  female     ENDOSCOPIST:  Rachael Fee, MD     REFERRED BY:  Hedwig Morton. Juanda Chance, M.D.     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 The patient was then placed in the left, lateral,     decubitus postion and IV sedation was administered. Throughout the     procedure, the patient's blood pressure, pulse and oxygen     saturations were monitored continuously.  Under direct     visualization, the Pentax EUS Radial [id number] and Pentax Linear     [id number] endoscope was introduced through the mouth and advanced to     the stomach body.  Water was used as necessary to provide an     acoustic interface.  Upon completion of the imaging, water was     removed and the patient was sent to the recovery room in     satisfactory condition.     <<PROCEDUREIMAGES>>     Endoscopic findings:     1. The esophagus is anatomically abnormal, tortuous distally.     There is a friable, large but shallow ulceration mid/distal     esophagus. Minor scope trauma caused oozing of blood. This did not     appear neoplastic, rather more acid/reflux related. There was a     long segment of Barrett's type changes that extend from GE     junction up to about 15cm from  incisors. Given friability, oozing,     I did not perform any biopsies.     2. The stomach is very mis-shapen and I could not pass standard     gastroscrope or larger diameter radial EUS scope into distal     stomach or duodenum.     EUS findings:     1. The exam was very limited due to signficant gastric anatomic     abnormality.     2. Much of the pancreas was not visible, the cyst noted on CT in     November was not visible.  The parenchyma and main pancreatic duct     in distal body and tail of pancreas were normal.     3. Limited views of liver, spleen were all normal.           Impression:     She has a very anatomically abnormal upper GI tract from large  hiatal hernia (or elevated left hemidiaphragm described on CT).     This distorted anatomy will not allow for evaluation of the     pancreatic cyst.  The anatomy also is probably causing significant     GERD. She has a large (likely acid related), shallow ulcer in     distal to mid esophagus and an even longer segment of Barrett's     changes.  The esophagus was very friable, oozed with minor scope     trauma, biopsies were not performed.     She is on once daily PPI, I will increase this to TID given the     severity of acid damage. She should probably have repeat EGD to     re-evaluate esophagus after some resolution of esophagitis to     check for underlying neoplasm.  I will leave this to Dr. Juanda Chance.     The pancreatic cyst should be followed with repeat imaging in 9     months (1 year from original CT scan).           _____________________________     Rachael Fee, MD           n.     eSIGNED:   Rachael Fee at 08/15/2010 08:59 AM           Fredda Hammed, [id number]  Note: An exclamation mark (!) indicates a result that was not dispersed into the flowsheet. Document Creation Date: 08/15/2010 9:00 AM _______________________________________________________________________  (1) Order result status:  Final Collection or observation date-time: 08/15/2010 08:45 Requested date-time:  Receipt date-time:  Reported date-time:  Referring Physician:   Ordering Physician: Rob Bunting [phone]) Specimen Source:  Source: Launa Grill Order Number: [id number] Lab site:

## 2010-09-05 ENCOUNTER — Telehealth (INDEPENDENT_AMBULATORY_CARE_PROVIDER_SITE_OTHER): Payer: Self-pay | Admitting: *Deleted

## 2010-09-10 NOTE — Progress Notes (Signed)
----   Converted from flag ---- ---- 09/05/2010 9:14 AM, Hart Carwin MD wrote: Crystal Braun, I have spoken to Autumn Messing her sister and about the results of EUS. We agreed for her to have a repeat CT scan of the abdome in early November 2012, oral and IV contrast. She is in a wheelchair so ,may be [facility] facility may be more comvenient than [facility] facility. Please put Your self a note for recall. thanx ------------------------------       Additional Follow-up for Phone Call Additional follow up Details #2::    Flag sent to me to remind me to schedule this. Follow-up by: Jesse Fall RN,  September 05, 2010 10:17 AM

## 2010-09-18 ENCOUNTER — Ambulatory Visit: Payer: BLUE CROSS/BLUE SHIELD | Admitting: Internal Medicine

## 2010-09-24 LAB — RENAL FUNCTION PANEL
Albumin: 2.9 g/dL — ABNORMAL LOW (ref 3.5–5.2)
CO2: 25 mEq/L (ref 19–32)
Chloride: 111 mEq/L (ref 96–112)
Creatinine, Ser: 1.01 mg/dL (ref 0.4–1.2)
GFR calc Af Amer: >60 mL/min (ref >60)
GFR calc non Af Amer: 54 mL/min — ABNORMAL LOW (ref >60)
Potassium: 3.8 mEq/L (ref 3.5–5.1)

## 2010-09-24 LAB — TSH: TSH: 1.535 u[IU]/mL (ref 0.350–4.500)

## 2010-09-24 LAB — VITAMIN B12: Vitamin B-12: 305 pg/mL (ref 211–911)

## 2010-09-24 LAB — CEA: CEA: 1.3 ng/mL (ref 0.0–5.0)

## 2010-09-24 LAB — COMPREHENSIVE METABOLIC PANEL
Albumin: 3 g/dL — ABNORMAL LOW (ref 3.5–5.2)
BUN: 6 mg/dL (ref 6–23)
Calcium: 8.7 mg/dL (ref 8.4–10.5)
Glucose, Bld: 101 mg/dL — ABNORMAL HIGH (ref 70–99)
Sodium: 144 mEq/L (ref 135–145)
Total Protein: 6.5 g/dL (ref 6.0–8.3)

## 2010-09-24 LAB — CBC
HCT: 37.2 % (ref 36.0–46.0)
MCHC: 33.5 g/dL (ref 30.0–36.0)
MCV: 87.8 fL (ref 78.0–100.0)
Platelets: 251 10*3/uL (ref 150–400)
RDW: 15.4 % (ref 11.5–15.5)

## 2010-09-24 LAB — IRON AND TIBC
Iron: 22 ug/dL — ABNORMAL LOW (ref 42–135)
TIBC: 264 ug/dL (ref 250–470)
UIBC: 242 ug/dL

## 2010-10-01 LAB — DIFFERENTIAL
Eosinophils Absolute: 0 10*3/uL (ref 0.0–0.7)
Eosinophils Relative: 0 % (ref 0–5)
Lymphocytes Relative: 11 % — ABNORMAL LOW (ref 12–46)
Lymphs Abs: 1.6 10*3/uL (ref 0.7–4.0)
Monocytes Absolute: 0.9 10*3/uL (ref 0.1–1.0)
Monocytes Relative: 6 % (ref 3–12)

## 2010-10-01 LAB — CBC
HCT: 38.4 % (ref 36.0–46.0)
Hemoglobin: 12.7 g/dL (ref 12.0–15.0)
MCV: 94.1 fL (ref 78.0–100.0)
RBC: 4.08 MIL/uL (ref 3.87–5.11)
WBC: 13.9 10*3/uL — ABNORMAL HIGH (ref 4.0–10.5)

## 2010-10-01 LAB — URINALYSIS, ROUTINE W REFLEX MICROSCOPIC
Glucose, UA: NEGATIVE mg/dL
Protein, ur: NEGATIVE mg/dL
Specific Gravity, Urine: 1.026 (ref 1.005–1.030)

## 2010-10-01 LAB — URINE MICROSCOPIC-ADD ON

## 2010-10-01 LAB — BASIC METABOLIC PANEL
Chloride: 104 mEq/L (ref 96–112)
GFR calc non Af Amer: 58 mL/min — ABNORMAL LOW (ref >60)
Potassium: 4.1 mEq/L (ref 3.5–5.1)
Sodium: 139 mEq/L (ref 135–145)

## 2010-10-01 LAB — PROTIME-INR: Prothrombin Time: 13.6 seconds (ref 11.6–15.2)

## 2010-10-03 ENCOUNTER — Other Ambulatory Visit: Payer: Self-pay | Admitting: Geriatric Medicine

## 2010-10-03 ENCOUNTER — Ambulatory Visit
Admission: RE | Admit: 2010-10-03 | Discharge: 2010-10-03 | Disposition: A | Discharge status: Home / Self Care | Payer: BLUE CROSS/BLUE SHIELD | Source: Ambulatory Visit | Attending: Geriatric Medicine | Admitting: Geriatric Medicine

## 2010-10-03 DIAGNOSIS — Z0000 Encounter for general adult medical examination without abnormal findings: Secondary | ICD-10-CM

## 2010-10-22 LAB — TYPE AND SCREEN
ABO/RH(D): O POS
Antibody Screen: NEGATIVE

## 2010-10-22 LAB — BASIC METABOLIC PANEL
BUN: 10 mg/dL (ref 6–23)
BUN: 7 mg/dL (ref 6–23)
BUN: 8 mg/dL (ref 6–23)
BUN: 9 mg/dL (ref 6–23)
CO2: 31 mEq/L (ref 19–32)
Calcium: 7.5 mg/dL — ABNORMAL LOW (ref 8.4–10.5)
Calcium: 8.6 mg/dL (ref 8.4–10.5)
Chloride: 102 mEq/L (ref 96–112)
Chloride: 106 mEq/L (ref 96–112)
Chloride: 107 mEq/L (ref 96–112)
Creatinine, Ser: 0.77 mg/dL (ref 0.4–1.2)
Creatinine, Ser: 0.84 mg/dL (ref 0.4–1.2)
Creatinine, Ser: 0.85 mg/dL (ref 0.4–1.2)
GFR calc Af Amer: >60 mL/min (ref >60)
GFR calc non Af Amer: >60 mL/min (ref >60)
GFR calc non Af Amer: >60 mL/min (ref >60)
GFR calc non Af Amer: >60 mL/min (ref >60)
Glucose, Bld: 105 mg/dL — ABNORMAL HIGH (ref 70–99)
Glucose, Bld: 81 mg/dL (ref 70–99)
Glucose, Bld: 92 mg/dL (ref 70–99)
Potassium: 3.4 mEq/L — ABNORMAL LOW (ref 3.5–5.1)
Potassium: 3.7 mEq/L (ref 3.5–5.1)
Potassium: 3.9 mEq/L (ref 3.5–5.1)
Sodium: 136 mEq/L (ref 135–145)
Sodium: 141 mEq/L (ref 135–145)

## 2010-10-22 LAB — CROSSMATCH

## 2010-10-22 LAB — CK TOTAL AND CKMB (NOT AT ARMC)
CK, MB: 3.2 ng/mL (ref 0.3–4.0)
CK, MB: 3.3 ng/mL (ref 0.3–4.0)
Relative Index: 0.4 (ref 0.0–2.5)
Relative Index: 0.4 (ref 0.0–2.5)
Total CK: 786 U/L — ABNORMAL HIGH (ref 7–177)
Total CK: 852 U/L — ABNORMAL HIGH (ref 7–177)

## 2010-10-22 LAB — DIFFERENTIAL
Basophils Absolute: 0 10*3/uL (ref 0.0–0.1)
Lymphocytes Relative: 13 % (ref 12–46)
Lymphs Abs: 1.7 10*3/uL (ref 0.7–4.0)
Monocytes Absolute: 1.2 10*3/uL — ABNORMAL HIGH (ref 0.1–1.0)
Monocytes Relative: 11 % (ref 3–12)
Monocytes Relative: 6 % (ref 3–12)
Neutro Abs: 11.1 10*3/uL — ABNORMAL HIGH (ref 1.7–7.7)
Neutro Abs: 8 10*3/uL — ABNORMAL HIGH (ref 1.7–7.7)
Neutrophils Relative %: 75 % (ref 43–77)
Neutrophils Relative %: 82 % — ABNORMAL HIGH (ref 43–77)

## 2010-10-22 LAB — CBC
HCT: 23.5 % — ABNORMAL LOW (ref 36.0–46.0)
HCT: 26.1 % — ABNORMAL LOW (ref 36.0–46.0)
HCT: 27.9 % — ABNORMAL LOW (ref 36.0–46.0)
HCT: 28.8 % — ABNORMAL LOW (ref 36.0–46.0)
HCT: 29.1 % — ABNORMAL LOW (ref 36.0–46.0)
HCT: 34 % — ABNORMAL LOW (ref 36.0–46.0)
Hemoglobin: 10 g/dL — ABNORMAL LOW (ref 12.0–15.0)
Hemoglobin: 10.2 g/dL — ABNORMAL LOW (ref 12.0–15.0)
Hemoglobin: 10.5 g/dL — ABNORMAL LOW (ref 12.0–15.0)
Hemoglobin: 11.6 g/dL — ABNORMAL LOW (ref 12.0–15.0)
MCHC: 34.2 g/dL (ref 30.0–36.0)
MCHC: 34.7 g/dL (ref 30.0–36.0)
MCV: 91.1 fL (ref 78.0–100.0)
MCV: 93.4 fL (ref 78.0–100.0)
MCV: 94.3 fL (ref 78.0–100.0)
Platelets: 129 10*3/uL — ABNORMAL LOW (ref 150–400)
Platelets: 156 10*3/uL (ref 150–400)
Platelets: 156 10*3/uL (ref 150–400)
Platelets: 156 10*3/uL (ref 150–400)
Platelets: 192 10*3/uL (ref 150–400)
RBC: 3.3 MIL/uL — ABNORMAL LOW (ref 3.87–5.11)
RDW: 13.7 % (ref 11.5–15.5)
RDW: 14.5 % (ref 11.5–15.5)
RDW: 14.8 % (ref 11.5–15.5)
RDW: 15.3 % (ref 11.5–15.5)
RDW: 15.4 % (ref 11.5–15.5)
RDW: 16 % — ABNORMAL HIGH (ref 11.5–15.5)
WBC: 11.6 10*3/uL — ABNORMAL HIGH (ref 4.0–10.5)
WBC: 14 10*3/uL — ABNORMAL HIGH (ref 4.0–10.5)
WBC: 9.6 10*3/uL (ref 4.0–10.5)
WBC: 9.8 10*3/uL (ref 4.0–10.5)

## 2010-10-22 LAB — PROTIME-INR
INR: 1 (ref 0.00–1.49)
INR: 2.2 — ABNORMAL HIGH (ref 0.00–1.49)
INR: 2.8 — ABNORMAL HIGH (ref 0.00–1.49)
Prothrombin Time: 13.6 seconds (ref 11.6–15.2)
Prothrombin Time: 25.4 seconds — ABNORMAL HIGH (ref 11.6–15.2)
Prothrombin Time: 31.4 seconds — ABNORMAL HIGH (ref 11.6–15.2)
Prothrombin Time: 36.4 seconds — ABNORMAL HIGH (ref 11.6–15.2)

## 2010-10-22 LAB — URINALYSIS, ROUTINE W REFLEX MICROSCOPIC
Glucose, UA: NEGATIVE mg/dL
Ketones, ur: NEGATIVE mg/dL
Protein, ur: NEGATIVE mg/dL

## 2010-10-22 LAB — URINE MICROSCOPIC-ADD ON

## 2010-10-22 LAB — TROPONIN I
Troponin I: 0.01 ng/mL (ref 0.00–0.06)
Troponin I: 0.02 ng/mL (ref 0.00–0.06)
Troponin I: 0.02 ng/mL (ref 0.00–0.06)

## 2010-10-22 LAB — APTT: aPTT: 27 seconds (ref 24–37)

## 2010-10-22 LAB — HEMOCCULT GUIAC POC 1CARD (OFFICE): Fecal Occult Bld: NEGATIVE

## 2010-10-22 LAB — COMPREHENSIVE METABOLIC PANEL
BUN: 14 mg/dL (ref 6–23)
Calcium: 8.8 mg/dL (ref 8.4–10.5)
Glucose, Bld: 147 mg/dL — ABNORMAL HIGH (ref 70–99)
Sodium: 141 mEq/L (ref 135–145)
Total Protein: 6.6 g/dL (ref 6.0–8.3)

## 2010-10-22 LAB — PREPARE RBC (CROSSMATCH)

## 2010-10-22 LAB — D-DIMER, QUANTITATIVE: D-Dimer, Quant: 1.7 ug/mL-FEU — ABNORMAL HIGH (ref 0.00–0.48)

## 2010-10-22 LAB — HEMOGLOBIN A1C: Hgb A1c MFr Bld: 5.2 % (ref 4.6–6.1)

## 2010-10-22 LAB — POCT I-STAT 4, (NA,K, GLUC, HGB,HCT)
Glucose, Bld: 150 mg/dL — ABNORMAL HIGH (ref 70–99)
HCT: 31 % — ABNORMAL LOW (ref 36.0–46.0)
Hemoglobin: 10.5 g/dL — ABNORMAL LOW (ref 12.0–15.0)
Potassium: 3.3 mEq/L — ABNORMAL LOW (ref 3.5–5.1)
Sodium: 137 mEq/L (ref 135–145)

## 2010-11-26 ENCOUNTER — Telehealth: Payer: Self-pay | Admitting: *Deleted

## 2010-11-26 NOTE — Discharge Summary (Signed)
NAMESPENCER, PETERKIN              ACCOUNT NO.:  [id number]   MEDICAL RECORD NO.:  [id number]          PATIENT TYPE:  INP   LOCATION:  5525                         FACILITY:  [facility]   PHYSICIAN:  Tera Mater. Evlyn Kanner, M.D. DATE OF BIRTH:  Sep 22, 1940   DATE OF ADMISSION:  09/24/2007  DATE OF DISCHARGE:  09/29/2007                               DISCHARGE SUMMARY   ADDENDUM TO DISCHARGE SUMMARY   I left a couple of things off.   The patient does have a decubitus, which will have the wound care nurse  check on this and check her twice a day.  We will be careful on turning  the patient.  We will also continue oxygen at 2 liters until her sats  remain well off of it.           ______________________________  Tera Mater. Evlyn Kanner, M.D.     SAS/MEDQ  D:  09/29/2007  T:  09/29/2007  Job:  [id number]

## 2010-11-26 NOTE — Op Note (Signed)
Crystal Braun, Crystal Braun              ACCOUNT NO.:  [id number]   MEDICAL RECORD NO.:  [id number]          PATIENT TYPE:  AMB   LOCATION:  SDC                           FACILITY:  [facility]   PHYSICIAN:  Lenoard Aden, M.D.DATE OF BIRTH:  Nov 05, 1940   DATE OF PROCEDURE:  03/08/2007  DATE OF DISCHARGE:                               OPERATIVE REPORT   PREOPERATIVE DIAGNOSIS:  1. High-grade SIL on Pap smear.  2. Schizophrenia.   POSTOPERATIVE DIAGNOSIS:  1. High-grade SIL on Pap smear.  2. Schizophrenia.   PROCEDURE:  Loupe electrical surgical excision of the cervix.   SURGEON:  Lenoard Aden, M.D.   ANESTHESIA:  MAC and intracervical block.  The patient to recovery in  good condition.   SPECIMEN:  Includes transformation zone, deep transformation zone x2,  transformation zone at 9 o'clock, and transformation zone and 3 o'clock  to pathology.   DESCRIPTION OF PROCEDURE:  After the patient's daughter who has medical  power of attorney is apprised of the risks of anesthesia, infection,  bleeding, injury to abdominal organs need for repair, delayed versus  immediate complications to include bleeding and possible need for  hysterectomy.  The patient brought to the operating room where she was  administered IV sedation without difficulty, prepped and draped in  sterile fashion, catheterized until bladder was empty. Speculum was  placed revealing a nulliparous appearing cervix, intracervical block  placed using a dilute Xylocaine and epinephrine based solution at 12, 3,  9, and 6 o'clock intracervically.  A small LEEP electrode loupe is used  to take multiple passes as noted to the transformation zone after  coating the cervix with Lugol's solution and identifying the  transformation zone. This is done with one transformation zone  initially, then two deep transformation zone passes, and then  transmissions and specimen is removed at 3 and 9 o'clock. All sent to  pathology.  Coagulation is achieved using coagulation ball without  difficulty.  Monsel's solution was placed.  The patient tolerates  procedure well and was transferred to recovery in good condition.      Lenoard Aden, M.D.  Electronically Signed     RJT/MEDQ  D:  03/08/2007  T:  03/08/2007  Job:  [id number]

## 2010-11-26 NOTE — Discharge Summary (Signed)
Crystal Braun, Crystal Braun              ACCOUNT NO.:  [id number]   MEDICAL RECORD NO.:  [id number]          PATIENT TYPE:  INP   LOCATION:  1432                         FACILITY:  [facility]   PHYSICIAN:  Kari Baars, M.D.  DATE OF BIRTH:  [birth year]   DATE OF ADMISSION:  12/01/2006  DATE OF DISCHARGE:  12/10/2006                               DISCHARGE SUMMARY   DISCHARGE DIAGNOSES:  1. Left displaced femoral neck fracture, status post left Austin-Moore      hemiarthroplasty (Dec 01, 2006).  2. Severe osteoporosis.  3. Hypoxic respiratory failure secondary to atelectasis/mucus plugging      versus aspiration pneumonia, improved.  4. Hypotension secondary to hypovolemia, resolved.  5. 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  6. Probable dementia, on therapy.  7. History of depression.  8. Postoperative anemia requiring transfusion.   HOSPITAL PROCEDURES:  1. Left Austin-Moore hemiarthroplasty (Dec 01, 2006).  2. CT of the chest with contrast (pulmonary embolus protocol), (Dec 02, 2006), no pulmonary embolism.  Extensive frothy fluid in the      tracheobronchial tree, especially on the left side, with plugging      of multiple bronchi occluding the left lower and upper lobe bronchi      as well as the right lower lobe bronchi with prominent airspace      opacity in the left lower lobe and scattered airspace opacities in      the right lower lobe.  Appearance suggests aspiration pneumonitis      or pneumonia with tracheobronchial spread.  Dilated esophagus which      is fluid-filled with indistinctness of the surrounding mediastinal      adipose tissue.  Small bilateral pleural effusions.  3. Modified barium swallow (UEA54):  Minimal pharyngeal dysphagia with      trace frank penetration of thin liquids when taking pill that      adequately cleared with further swallows.  No aspiration observed.      Recommend  dysphagia III diet with thin liquids and strict      aspiration precautions.  4. Transfusion of packed red blood cells.  5. Peripherally inserted central catheter line placement (May 22).   DISCHARGE MEDICATIONS:  1. Depakote 750 mg nightly.  2. Colace 100 mg b.i.d.  3. Aricept 10 mg nightly.  4. Nu-Iron 150 mg daily.  5. Prozac 20 mg daily.  6. Namenda 10 mg b.i.d.  7. Calcium 600 mg plus vitamin D 400 units three times daily with      meals.  8. Multivitamin.  9. Protonix 40 mg daily.  10.Trilafon 4 mg nightly.  11.Coumadin per pharmacy protocol for DVT prophylaxis (goal INR 1.5 to      2), continue for 1 month.  12.Mucinex one p.o. b.i.d. p.r.n. cough.  13.Albuterol nebulizers q.6 h. p.r.n. shortness of breath or wheezing.  14.Oxygen 2 L continuous.  May wean to keep oxygen saturation above      90%.  15.Vicodin one to two q.6 h. p.r.n. moderate to severe pain.  16.Tylenol 650 mg  q.6 h. p.r.n. mild to moderate pain.  17.Resume Fosamax 70 mg weekly for osteoporosis, once she is      ambulatory.   HISTORY OF PRESENT ILLNESS:  For full details, please see dictated  history and physical.  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  She apparently had a  mechanical fall at her assisted living facility and reported pain.  She  was transported to the emergency department the following day, where x-  rays revealed a subcapital fracture of the left femoral neck with  superior displacement.  She was admitted for further management.   HOSPITAL COURSE:  Given the patient's limited medical comorbidity, she  was initially admitted to Dr. Luiz Blare' service and  underwent left Romilda Joy hemiarthroplasty on the evening of admission.  She initially  tolerated this well and returned to the PACU.  Dr. Clelia Croft, General  Internal Medicine, was consulted for medical management and saw the  patient in the PACU.  At that point, she was doing well.  Subsequently,  after return to the floor, the patient was noted to be hypoxic with  oxygen saturations in the 70% with tachycardia.  Chest x-ray revealed  bilateral airspace opacities concerning for pneumonia.  Given the  immediate postop onset, a CT of the chest was performed to rule out  pulmonary embolism or fat embolus and to further evaluate the airspace  opacities.  This did not show any pulmonary embolism, but did reveal  significant bilateral airspace disease with tracheobronchial fluid  consistent with aspiration versus mucus plugging.  She was transferred  to the ICU for aggressive pulmonary toilet and medical management.  She  subsequently became hypotensive, but responded well to aggressive volume  resuscitation and blood products.  She received a total of 3 units of  packed red blood cells postoperatively with stabilization of her  hemoglobin.  With supportive management including Zosyn for anaerobic  coverage, oxygen, which was weaned to 2 L, bronchodilator therapy and  volume resuscitation, the patient's medical condition stabilized and she  was transferred to the floor.  She has been weaned to 2 L of oxygen and  her fluids have been discontinued.  She is now tolerating p.o.'s.  She  is participating with physical therapy and is ambulating assistance.  She is deconditioned due to her hip fracture and will need short-term  rehab prior to return to assisted living.   Initially, there was some concern on family members' behavior that her  fall may have been at least related to oversedation from her medications.  Dr. Donell Beers was in the process of making modifications in  her antipsychotic therapy.   She has been on Aricept and Namenda  chronically for presumed dementia, though her memory testing is limited  due to her mental illness and the diagnosis of dementia is a bit  suspect.  She has been on Abilify for an antipsychotic and the family  felt that this was too sedating.  Dr. Donell Beers had actually changed this  to Trilafon, but she received her first dose on the evening after her  fall.  At this point, she is back on Trilafon and adjustments may be  necessary to her psychotropic medications.  At this point, her mental  status is clear.  She is alert.   At this point, the patient is stable for discharge to a nursing facility  for ongoing rehab.   DISCHARGE LABORATORY DATA:  INR on the day of discharge 1.4, on Coumadin  for DVT prophylaxis.  BMET shows sodium 137, potassium 3.4, chloride 96,  bicarb 35, BUN 7, creatinine 0.5, glucose 84.  CBC shows a white count  of 5.3, hemoglobin 10.5 after transfusion, platelets 337,000.  INR is  1.4.  Anemia profile is consistent with anemia of chronic disease with  an iron level of 14, ferritin 130, percent saturation 8.  Vitamin B12  and folic acid are normal.  Reticulocyte count 1.7%.  She is on iron  therapy following her transfusion.   DISCHARGE DIET:  D-III with thin liquids and strict aspiration  precautions.   HOSPITAL FOLLOWUP:  She will follow up with Dr. Luiz Blare in 3 weeks for  postop evaluation.  She should have her left hip staples removed in 1  week.   DISCHARGE INSTRUCTIONS:  She should continue physical therapy and  occupational therapy as tolerated.  Continue Coumadin for 1 month postop  for DVT prophylaxis.  Management will be per the physician at [facility].   DISPOSITION:  To [facility] for ongoing  rehabilitation.      Kari Baars, M.D.  Electronically Signed     WS/MEDQ  D:  12/10/2006  T:  12/10/2006  Job:  [id number]   cc:   Harvie Junior, M.D.  Fax: [phone]    Archer Asa, M.D.

## 2010-11-26 NOTE — Assessment & Plan Note (Signed)
[facility]   NAME:  Crystal Braun, Crystal Braun NO.:  [id number]   MEDICAL RECORD NO.:  [id number]      DATE OF BIRTH:  [birth year]   PHYSICIAN:  Maxwell Caul, M.D. VISIT DATE:  01/12/2008                                   OFFICE VISIT   [name] is accompanied today by family members.  She is a chronic  resident of [facility].  We have been following her for  pressure ulcers of the right sacral area.  She had been awarded  antibacterial soap washes and all pressure relief type measures have  been put in place by the facility.   PHYSICAL EXAMINATION:  Her buttocks ulcers have resolved.  There is nice  healing here.  The sacral ulcer still looks as though it might have a  small recess, although I could not probe it.  There was no drainage and  no tenderness.   IMPRESSION:  Pressure ulcers, buttocks.  I think these are largely  resolved.  I think the area looks very healthy to me.  It is credit to  the facility that the healing has been so rapid.           ______________________________  Maxwell Caul, M.D.     MGR/MEDQ  D:  01/12/2008  T:  01/13/2008  Job:  [id number]

## 2010-11-26 NOTE — H&P (Signed)
NAMEBENNETTA, RUDDEN NO.:  [id number]   MEDICAL RECORD NO.:  [id number]          PATIENT TYPE:  INP   LOCATION:  2622                         FACILITY:  [facility]   PHYSICIAN:  Tera Mater. Evlyn Kanner, M.D. DATE OF BIRTH:  12-Apr-1941   DATE OF ADMISSION:  09/24/2007  DATE OF DISCHARGE:                              HISTORY & PHYSICAL   HISTORY OF PRESENT ILLNESS:  [name] is a 70 year old white female,  longstanding patient of mine at the [facility].  She has  prior history including respiratory failure, aspiration pneumonia,  schizoaffective disorder, and dementia. She was in her usual state of  fair health until mid morning.  I actually observed her yesterday in the  morning at the nursing home without problems.  Reportedly she was normal  this morning, ate breakfast without trouble and then mid morning was  noted to have respiratory distress, hypotension, low oxygen saturations,  and diminished level of consciousness.  She was promptly sent to the  emergency room here and was found to be septic with hypotension and  bilateral pneumonia.  They have done an excellent job here in the  emergency room, giving her broad-spectrum antibiotics in the form of  vancomycin and Zosyn. She has had fluid boluses and has had a minor  stabilization of her blood pressure.  Anytime we cut back on the fluids,  however, her blood pressure has gone back down.  Interestingly the  patient is awake, alert and at her baseline.  She denies any breathing  trouble.  She has no chest pain.  She is not hurting anywhere.  She  recognizes her sister, recognizes me.  Really does not have any  complaints at all.  She did have some chills earlier.  These have  resolved at the present time though despite the severity of her illness,  she looks as noted better than expected.   MEDICATIONS:  Her medication list includes:  1. Aspirin 81 mg.  2. Multivitamins.  3. Iron.  4. Prozac 20 mg.  5. __________, which she has been on since last July.  6. Protonix 40 once a day.  7. Colace 100 twice a day.  8. Megace 40 twice a day.  9. Namenda 10 twice a day.  10.Calcium three times a day.  11.Aricept 10 at bedtime.  12.Depakote 750 at bedtime.  13.Trilafon 2 mg at bedtime.   PAST MEDICAL HISTORY:  Her past medical history includes:  1. Left displaced femoral neck fracture with an Austin-Moore knee      arthroplasty in May 2008.  2. She has a history of severe osteoporosis.  3. Hypoxic respiratory failure due to atelectasis and aspiration      pneumonia in 2008.  4. Hypotension due to hypovolemia.  5. Schizotypal personal disorder.  6. Obsessive compulsive disorder, hoarding behavior.  7. History of psychosis.  8. Dementia.  9. Depression.  10.Postoperative anemia requiring transfusion.  11.She has gastroesophageal reflux.   PHYSICAL EXAMINATION:  VITAL SIGNS:  Here is the emergency room blood  pressure initially was 55/34.  Now it is about is 74/40.  Temperature  was  100.2 rectally, pulse 124 and in what seemed to be a sinus tachy.  Now it is about 102 on the monitor.  Respiration rates were 22 ranging  up to 38.  She is breathing more comfortably a rate of about 18 at the  present.  Oxygen saturation most recently was 97% on face mask,  HEENT:  Sclerae anicteric.  Face appears generally symmetric.  Oral  mucous membranes are moist.  There is minimal facial movement.  There is  no tardive changes noted of her face at present.  NECK:  Supple with no neck vein distention.  No bruits could be heard.  LUNGS:  Reveal coarse breath sounds, especially in the right mid lung  and most of the left lung.  No accessory muscles in use at present and  she is having no stridor.  HEART:  Tachycardic and regular with systolic murmur.  ABDOMEN:  Soft, nontender, scaphoid with no masses or pulsations.  EXTREMITIES:  Pulses are slightly diminished.  She has marked wasting of   musculature with fixed contractions in her feet.  There is a lesion on  the top of her left foot as well.  The patient's hands are cool.  Pulses  are present.  NEUROLOGIC:  The patient is awake.  She is mentating at her baseline.  She answers all my questions with a monotone.  Clear, low volume speech  which is her baseline.  She is globally weak.   LABORATORY DATA:  Urine is amber, cloudy, specific gravity 1.029, pH of  5, moderate bilirubin, 0-2 white cells.  Sodium is 147, potassium 3.9,  chloride 117, BUN 38,  Creatinine is 1.38.  BNP is elevated at 706 and D-  dimer is elevated 3.65.  Cardiac markers show myoglobin of 168, troponin  less than 0.05.  White count 9000, hemoglobin 8.6, MCV is 89.8,  platelets 187,000. A chest x-ray is consistent with right pneumonia at  the infrahilar area, left lower lobe atelectasis or infiltrate, and  there is marked elevation left hemidiaphragm that is stable.  Changes of  COPD are noted.   SUMMARY:  In summary, we have a 70 year old white female resident of a  nursing home who presents acutely with pneumonia, sepsis, hypotension.  The patient actually looks clinically better than expected with her  mental status are much better than expected for the current vital signs.  She has been quite dependent on fluids and I am going to cover her with  steroids to make sure there is not some adrenal insufficiency.  She has  already received vancomycin and Zosyn and at the present time she seems  to be doing better. Blood cultures have been sent and additionally the  patient may require pressor support.  I think this is reasonable in  light of the possible reversibility of this, also talked with her  daughter who agrees with this.  Oxygenation is doing fairly well at the  present time.  Other problems including a schizophrenic disorder and  dementia.  She seems to be at her baseline. An elevated BNP is  interesting in that she had a preserved ejection  fraction and a pretty  good echo in December 2008 with an EF of 55-60%.  I suspect she is  showing some abnormalities due to the sepsis syndrome.  Regarding her  hip fracture, she really has not been ambulatory since then and her  anemia will need to be monitored and may require transfusion.  The  patient is a limited code with only pressors in central access.  No  intubation, defibrillation or other measures.  This was confirmed again  at the bedside with her sister.  The patient will be monitored on  telemetry.  Hopefully she can get through this tough illness.           ______________________________  Tera Mater Evlyn Kanner, M.D.     SAS/MEDQ  D:  09/24/2007  T:  09/26/2007  Job:  [id number]

## 2010-11-26 NOTE — Telephone Encounter (Signed)
[facility] is requesting refills on patient's Protonix tid dosing. Dr Christella Hartigan gave patient Protonix TID at her EUS on 08/15/10 due to esophageal ulcer and Barretts Changes. However, I see he suggested that patient may need to be rescoped after resolution of the esophagitis but that he would leave it up to Dr Juanda Chance. Would you like patient to continue on TID dosing? Do you want to perform another EGD or continue to wait longer?

## 2010-11-26 NOTE — H&P (Signed)
NAMECADIENCE, BRADFIELD NO.:  [id number]   MEDICAL RECORD NO.:  [id number]          PATIENT TYPE:  INP   LOCATION:  3027                         FACILITY:  [facility]   PHYSICIAN:  Harvie Junior, M.D.   DATE OF BIRTH:  12/05/1940   DATE OF ADMISSION:  11/28/2008  DATE OF DISCHARGE:                              HISTORY & PHYSICAL   HISTORY:  Ms. Spackman is a 70 year old female with a history of having  had a left Romilda Joy hemiarthroplasty done couple of years ago.  She  was at a skilled nursing home when she had a fall and unfortunately  suffered a fracture of her distal femur.  She was admitted.  Came to the  emergency room where x-rays were taken and we were consulted to admit  this patient with a distal femur fracture for the possibility of  surgical intervention.  The patient is a poor historian in the sense  that she is demented and not able to give significant history but as  best we could tell was not complaining of any other significant areas of  pain elsewhere in her body.   PAST MEDICAL HISTORY:  Mostly coming from her sister, it is related for:  1. Osteoporosis.  2. Dementia.  3. Schizo-affective disorder.  4. GERD.  5. Anemia.  6. Depression.   PAST SURGICAL HISTORY:  Hemi hip on the left side in 2008.   MEDICATIONS ON ADMISSION:  Namenda, Depakote, mirtazapine, Darvocet,  Ferrex, protamine, aspirin, MiraLax, fluoxetine.   ALLERGIES:  CODEINE.   FAMILY HISTORY:  She is not a smoker and drinker.  She is still a  nursing home resident.  Family history is reviewed and noncontributory.   REVIEW OF SYSTEMS:  Negative as related to her HPI.   PHYSICAL EXAMINATION:  VITAL SIGNS:  Temperature of 97.5, pulse of 91,  blood pressure 110/69, respirations 18.  GENERAL: She was awake, alert, but not oriented to situation or place.  HEENT:  Within normal limits.  LUNGS:  Clear to auscultation.  HEART:  Regular rate and rhythm.  ABDOMEN:  Soft and  nontender.  EXTREMITIES:  Left leg was externally rotated and shortened, painful  with range of motion.  There is good distal pulses.  No ulcerations on  the skin distally.  She was in a knee immobilizer with Buck traction and  we kind of got the knee immobilizer off her, it was kind of digging into  the back of her skin.  There was no wound at that time but we got the  metal off the back of her skin.   LABORATORY DATAS ON ADMISSION:  Hemoglobin of 11.6, hematocrit of 34.0,  white blood cell count 13.6, and platelets of 192.  BMET showed a sodium  of 141, potassium 3.8, chloride 105, bicarb 27, BUN 14, creatinine 0.9,  glucose 147.   STUDIES:  EKG with sinus tachycardia with a questionable right axis and  left-bundle branch block.  Chest x-ray with no active disease.   ASSESSMENT:  A 70 year old female with left femur fracture that will  need a significant open  reduction and internal fixation.  The patient  will be admitted to the Orthopedic Service and Medicine will be  consulted for preoperative evaluation and clearance.  I had a long talk  with she and her family relative to the risks and benefits of the  surgery and ultimately feel that open reduction and internal fixation  must be an appropriate course of action but obviously carries  significant incompetent risk with her fragile medical condition and the  magnitude of the surgery.      Harvie Junior, M.D.  Electronically Signed     JLG/MEDQ  D:  11/29/2008  T:  11/30/2008  Job:  [id number]

## 2010-11-26 NOTE — Assessment & Plan Note (Signed)
[facility]   NAME:  DRINDA, BELGARD              ACCOUNT NO.:  [id number]   MEDICAL RECORD NO.:  [id number]      DATE OF BIRTH:  08-25-1940   PHYSICIAN:  Jake Shark A. Tanda Rockers, M.D. VISIT DATE:  11/01/2007                                   OFFICE VISIT   REASON FOR CONSULTATION:  Ms. Crystal Braun is a 70 year old lady referred by  Dr. Adrian Prince for evaluation of a nonhealing pressure ulcer of the  sacrum.   IMPRESSION:  Stage III pressure ulcer, right sacrum.   RECOMMENDATION:  1. Institute daily irrigation with normal saline and a one-fourth inch      Iodoform gauze packing.  The patient will need to be reevaluated in      2 weeks to assess her response to therapy.  2. Continued attention to nutritional support.   SUBJECTIVE:  Ms. Crystal Braun is a 70 year old lady who was evaluated in May  2008 for a fracture of the hip.  She was subsequently treated with  surgery, spent a total of 7 days in the hospital and was discharged.  She underwent a limited period of rehabilitation, but according to her  sister, spent considerable time in the forced supine position due to the  pain and immobility related to her left hip fracture.  It was noted upon  her arrival at [facility] that she did have a pressure ulcer  corresponding with the wound that she currently has been evaluated for.  There has been no interim fever.  There has been no excessive drainage.  She is currently being managed in a no code status with appropriate  documentation per her family with regard to the living will.  Her  appetite is poor.   ALLERGIES:  She is allergic to CODEINE.   CURRENT MEDICATION:  1. Prozac 20 mg daily.  2. Protonix 40 mg daily.  3. Nu-Iron 150 mg daily.  4. Namenda 10 mg 1 daily.  5. Aricept 10 mg daily.  6. Depakote 250 mg 3 nightly.  7. Remeron 15 mg nightly.  8. Trilafon 2 mg nightly.   PREVIOUS SURGERY:  Included only a D&C in addition to her recent left  hip fracture  repair.   FAMILY HISTORY:  Positive for cancer, stroke, and heart attack.   SOCIAL HISTORY:  Socially, she is not married.  She is medically  disabled.  Her previous employment was as a [employer] operational officer.  She has no  children.  She is cared for by her sister.   REVIEW OF SYSTEMS:  She was ambulatory prior to her left hip fracture.  Since left hip fracture, she has not walked at all and in fact was  unable to complete a structured rehabilitation program.  Her mental  status is consistent with a moderately severe neurosis.  Her appetite is  poor.  She has lost 30 pounds over the last year.  She has had no  transient paralyses or stigmatas of TIA.  She has had no chest pain, has  had no evidence of myocardial insufficiency.  Her bowel and bladder  function are normal.  The remainder of the review of systems is  negative.   OBJECTIVE:  GENERAL:  On physical exam, she is a chronically ill and  poorly nourished female  in no acute distress.  She has an extremely flat  affect and is not spontaneous and does not answer questions.  She is  accompanied by her sister.  VITAL SIGNS:  Blood pressure is 99/67, respirations 16, pulse rate 97,  and temperature is 98.  HEENT:  Clear.  NECK:  Supple.  Trachea is midline.  The thyroid is nonpalpable.  LUNGS:  There are diffuse rhonchi throughout the lung fields.  HEART:  The heart sounds were normal.  ABDOMEN:  Scaphoid with no organomegaly.  EXTREMITIES:  The femoral pulses are 3+ at the pedal level.  There is no  evidence of decubitus.  The patient was examined from the left lateral  decubitus position, and there is a pressure ulcer extending down to the  sacrum as sounded with a Q-tip.  There is no particular malodor.  There  is a 2 cm of undermining between 9 and 6 o'clock.  The area surrounding  the wound is normal.  There is no maceration.  There is no loculations  or evidence of a deeper abscess.  NEUROLOGICALLY:  The patient is sensitive to pinprick  and soft touch.   DISCUSSION:  Ms. Baria has a small relatively clean stage III pressure  ulcer.  Due to its undermining component, we will recommend and that we  irrigate this wound and pack it with initially in Iodoform new gauze.  Once we have cleaned the cavity, we will begin using saline alone.  There is no evidence of infection at this point, there are concerns of  her 30-pound weight loss and the negative effect on healing.  We have  discussed this with the sister and the sister seems to understand.  The  dietary support at the nursing center will be encouraged.  We have given  the sister an opportunity to ask questions.  She seems to understand the  clinical impression, and the treatment plan, expresses gratitude for  having been seen in the clinic and indicates that she will be compliant  with the instructions as per above.  We will reevaluate the patient in 2  weeks.      [name], M.D.  Electronically Signed     HAN/MEDQ  D:  11/01/2007  T:  11/02/2007  Job:  [id number]

## 2010-11-26 NOTE — Op Note (Signed)
NAMEROXENE, ALVIAR              ACCOUNT NO.:  [id number]   MEDICAL RECORD NO.:  [id number]          PATIENT TYPE:  INP   LOCATION:  3016                         FACILITY:  [facility]   PHYSICIAN:  Harvie Junior, M.D.   DATE OF BIRTH:  1940-11-24   DATE OF PROCEDURE:  12/01/2006  DATE OF DISCHARGE:                               OPERATIVE REPORT   PREOPERATIVE DIAGNOSIS:  Femoral neck fracture, displaced, left.   POSTOPERATIVE DIAGNOSIS:  Femoral neck fracture, displaced, left.   PROCEDURE:  Left Romilda Joy hemiarthroplasty, left hip.   SURGEON:  Harvie Junior, M.D.   ASSISTANT:  Marshia Ly, P.A.-C.   ANESTHESIA:  General.   BRIEF HISTORY:  Ms. Butterbaugh is a 70 year old female with a history of  having fallen in the nursing home today.  She suffered a femoral neck  fracture which was markedly displaced.  She was seen in the emergency  room and got x-rays which showed this and we were consulted for  treatment.  We had a long discussion with she and her family and her  caregiver about appropriate treatment options, and ultimately felt that  surgical intervention was the most appropriate course of action.  She  was brought to the operating room for this procedure.   DESCRIPTION OF PROCEDURE:  The patient was taken to the operating room  and after adequate anesthesia was obtained with general anesthetic, the  patient was placed on the operating table.  She was then moved into the  right lateral decubitus position, all bony prominences were well padded.  An axillary roll was put into place.  Attention was turned to the left  hip which was prepped and draped in the usual sterile fashion.  Following this, a curved incision was made for a posterior approach to  the hip.  The subcutaneous tissue were dissected down to the level of  the gluteus maximus fascia.  This was divided in line with its fibers.  The gluteus maximus muscle was then finger fractured and a Charnley  retractor  was put in place.  The short external rotators were identified  and tagged.  The capsule was then cut off the intertrochanteric line and  T'd posteriorly back up past the acetabular rim.   At this point, a provisional neck cut was made off the femoral neck and  then the head was removed and measured on the back table to be 47 and a  47 Austin-Moore implant was opened.  The canal was then sequentially  rasped up to the level that was necessary for the standard 47 and the  Austin-Moore implant was then put in place.  Bone was placed in the  holes of the Austin-Moore to allow for additional bony ingrowth.  The  acetabulum was irrigated and debrided prior to placement of the Romilda Joy and no bony fragments were identified and the acetabulum looked to  be in excellent shape.  At this point, the hip was reduced.  The rent  posteriorly in the capsule was closed.  The piriformis tendon, which had  been taken down and tagged, was reattached  to the gluteus medius tendon  off the greater trochanter.  At this point, the tensor fascia and  gluteus maximus fascia was closed with a #1 Vicryl running  suture, the skin with 0 and 2-0 Vicryl, and skin staples.  A sterile  compressive dressing was applied as well as a knee immobilizer.  The  patient was taken to the recovery room where she was noted to be in  satisfactory condition.  Estimated blood loss was none.      Harvie Junior, M.D.  Electronically Signed     JLG/MEDQ  D:  12/01/2006  T:  12/01/2006  Job:  [id number]

## 2010-11-26 NOTE — Assessment & Plan Note (Signed)
[facility]   NAME:  Crystal Braun, Crystal Braun              ACCOUNT NO.:  [id number]   MEDICAL RECORD NO.:  [id number]      DATE OF BIRTH:  Nov 30, 1940   PHYSICIAN:  Jake Shark A. Tanda Rockers, M.D. VISIT DATE:  11/16/2007                                   OFFICE VISIT   SUBJECTIVE:  Ms. Shvartsman is a 70 year old lady who we are following for  a stage III sacral ulcer.  In the interim, she has been treated with  daily irrigations and a quarter-inch new gauze packing.  There has been  no report of excessive drainage, malodor, pain, or fever.  She is a  resident at [facility].  She has been on an interim  course of Cipro.   OBJECTIVE:  Her blood pressure is 107/69, respirations 18, pulse rate  101, temperature 97.  She is accompanied by an aide and her cousin.  The  patient was examined from the left lateral decubitus position.  The  wound over the sacrum is well circumscribed.  There is no excessive  drainage or fluctuance.  There is no periwound hyperemia, maceration, or  deleterious skin changes.  Sounding the wound with a Q-tip discloses  undermining at 3 o'clock of 2 cm and at 9 o'clock and at 3 o'clock, 9  cm.  The depth of the wound has decreased to 1.8 cm.  There is no  malodor or induration.   ASSESSMENT:  Clinical improvement stage III sacral pressure ulcer.   PLAN:  We will continue the daily irrigations and packing.  We will  reevaluate the patient in 1 month p.r.n.      [name], M.D.  Electronically Signed     HAN/MEDQ  D:  11/16/2007  T:  11/17/2007  Job:  [id number]

## 2010-11-26 NOTE — Consult Note (Signed)
Crystal Braun, Crystal Braun NO.:  [id number]   MEDICAL RECORD NO.:  [id number]          PATIENT TYPE:  INP   LOCATION:  3027                         FACILITY:  [facility]   PHYSICIAN:  Zannie Cove, MD     DATE OF BIRTH:  03-17-1941   DATE OF CONSULTATION:  DATE OF DISCHARGE:                                 CONSULTATION   ADMITTING PHYSICIAN:  Harvie Junior, M.D.   This is a consultation for preop clearance.   HISTORY OF PRESENTING ILLNESS:  History is primarily obtained from the  sister, Crystal Braun and partially from the patient as well.  Crystal Braun is  a 70 year old female, longstanding resident of SNF with past medical  history significant for dementia, presented to the ED with left distal  femur fracture status post fall.  Last night, the patient denies any  chest pain, shortness of breath, upper respiratory infection.  She has  limited mobility due to weakness in her legs following hip surgery.  Denies any cardiac complications after surgery.  No history of chest  pain.  No history of palpitations, syncope.  No fevers or chills.   PAST MEDICAL HISTORY:  Significant for dementia, schizoaffective  disorder, osteoporosis, GERD, anemia, depression.   PAST SURGICAL HISTORY:  Left hip replacement in 2008, no cardiac  complications following that.   MEDICATIONS:  Namenda 10 mg b.i.d., Depakote 750 nightly, Remeron 15 mg  nightly, Darvocet-N 100 two tabs t.i.d., Ferrex 150 b.i.d., Protonix 40  daily, aspirin 81 daily, MiraLax, and fluoxetine 20 mg daily.   ALLERGIES:  Allergic to CODEINE.   SOCIAL HISTORY:  Former smoker.  No alcohol.  She is a resident of  skilled nursing facility.   FAMILY HISTORY:  No premature coronary disease.   REVIEW OF SYSTEMS:  The patient denies any symptoms except for left hip  pain.   PHYSICAL EXAMINATION:  VITAL SIGNS:  Temp is 97.5, pulse is 91, blood  pressure 100/69, respirations 18, sating 97% on room air.  GENERAL:  Alert,  awake, oriented to place and person.  HEENT:  Pupils equal, reactive to light, moist mucous membranes.  No  JVD.  CARDIOVASCULAR:  S1 and S2.  Regular rate and rhythm.  LUNGS:  Clear to auscultation.  ABDOMEN:  Soft, nontender.  Positive bowel sounds.  EXTREMITIES:  Left leg in the dressing.  No edema.   LABORATORY DATA:  White count of 13.6, hemoglobin 11.6, platelets 192.  Sodium 141, potassium 3.8, chloride 105, bicarb 27, BUN 14, creatinine  0.9, glucose 147.  EKG was sinus tachycardia, rate of 100.  Right axis  and intraventricular conduction delay with question left bundle-branch  pattern.  Unfortunately, no prior EKG is available in our system and no  prior EKGs at the nursing home or with her primary care physician, Dr.  Leanord Braun.   ASSESSMENT AND PLAN:  A 70 year old female with:  1. Left hip fracture.  The patient is at moderate risk for      intermediate risk surgery based on risk factors; however, due to      left bundle-branch pattern and no prior  EKGs for comparison,      troponins were obtained which were within normal limits.  She was      continued on aspirin and the rest of her home medications.  Repeat      EKG showed intraventricular block pattern.  No dynamic changes or      ST-T changes and hence, no further cardiac workup is recommended      for cardiac risk stratification.  2. Deep vein thrombosis prophylaxis per the primary team.   Thank you for the consult.  We will follow with you.      Zannie Cove, MD  Electronically Signed     PJ/MEDQ  D:  11/28/2008  T:  11/29/2008  Job:  [id number]

## 2010-11-26 NOTE — Discharge Summary (Signed)
Crystal Braun, Crystal Braun              ACCOUNT NO.:  [id number]   MEDICAL RECORD NO.:  [id number]          PATIENT TYPE:  INP   LOCATION:  5003                         FACILITY:  [facility]   PHYSICIAN:  Harvie Junior, M.D.   DATE OF BIRTH:  1941/05/18   DATE OF ADMISSION:  11/28/2008  DATE OF DISCHARGE:  12/05/2008                               DISCHARGE SUMMARY   ADMITTING DIAGNOSIS:  1. Left distal femur fracture.  Prosthetic type, underneath a      hemiarthroplasty left hip.  2. Dementia.  3. Schizoaffective disorder.  4. Chronic osteoporosis.  5. Depression.  6. Chronic anemia.  7. Gastroesophageal reflux disease.   DISCHARGE DIAGNOSES:  1. Left distal femur fracture.  Prosthetic type, underneath a      hemiarthroplasty left hip.  2. Dementia.  3. Schizoaffective disorder.  4. Chronic osteoporosis.  5. Depression.  6. Chronic anemia.  7. Gastroesophageal reflux disease.  8. Acute blood loss anemia.   PROCEDURES IN THE HOSPITAL:  Open reduction internal fixation of left  distal femur fracture with plate around periprosthetics stem 11/29/2008,  Jodi Geralds M.D.   CONSULTATIONS:  [facility], Zannie Cove, M.D.   BRIEF HISTORY:  Crystal Braun is a 70 year old female who has dementia,  schizoaffective disorder who fell and suffered a left distal femoral  fracture with butterfly comminution.  She has a history of the  hemiarthroplasty in the left hip 2 years ago.  She came to the emergency  room and was evaluated by the orthopedic team and admitted for surgical  intervention of her significant left distal femur fracture.  She will  need a medical workup preoperatively per the [facility].   PERTINENT LABORATORY STUDIES:  EKG on admission showed normal sinus  rhythm with rightward access with nonspecific intraventricular block.  Preoperative chest x-ray showed stable chest x-ray. She did have some  marked elevation of the left hemidiaphragm.  Intraoperative  C-arm  imaging showed near anatomic alignment following ORIF of the left distal  femur.  Preoperative hemoglobin was 11.6, hematocrit 34.0.  BMET was  within normal limits.  Hemoglobin intraoperatively was 7.5, two units of  packed RBCs were transfused in the recovery room and hemoglobin was  10.5. On postop day #1 hemoglobin was 10.5, postop day #2 it was 9.8.  She drifted down to a hemoglobin of 8.2 with a hematocrit of 23.5. On  12/02/2008 two units packed RBCs were transfused.  Her hemoglobin went  up to 10.0 and hematocrit 28.8 on 12/03/2008.  Her BMET was within  normal limits throughout the hospitalization other than a slightly  decreased potassium of 3.3 on 12/03/2008.  Her potassium was 3.4, BUN  was 8, creatinine 0.80, sodium 141.  Protime on admission was 16 and an  INR of 1.2 on the day of discharge on Coumadin therapy.  Her protime was  31.4 seconds and INR 2.8.  Urinalysis on 11/28/2008 showed no  abnormalities with only rare bacteria.  X-rays of the left femur  preoperatively showed a spiral fracture of the mid to distal femoral  shaft with approximately one shaft with the  lateral and anterior  displacement of the distal fracture fragment.  X-rays of the tib-fib  showed no acute findings on the left tibia and fibula.  She did have  mild osteopenia.  X-rays of the left foot showed no gross acute bony  abnormalities of the left foot.  A 2-D echocardiogram on 12/01/2008  showed normal left ventricular size wall thickness, normal systolic  function was vigorous, estimated ejection fraction of 65%-70%.  No  abnormalities of the aortic valve appeared.  Mitral valve showed a  structurally normal valve.  Right ventricle cavity size is normal with  normal wall thickness and no regurgitation noted.  Tricuspid valve was  normal again.  Occult blood in the stool on 12/03/2008 was pending on  that date.   HOSPITAL COURSE:  The patient was admitted to the emergency room. She  was  placed in a 5-pound Buck's traction to left lower extremity.  Inpatient hospitalist consult was obtained for a medical clearance and  although there was some risk involved with this large fracture this all  was discussed with the patient.  She was felt to be medically cleared  for surgery.  She was taken to the operating room on 11/29/2008 where  she underwent ORIF of the left distal femur as well described in Dr.  Luiz Blare' operative note.  Preoperatively and postoperatively she was put  on a gram of Ancef IV q.8 h..  Incentive spirometry was encouraged and  Coumadin was started for DVT prophylaxis.  Postop day #1, she was  sitting up in the bed.  She had limited p.o. intake.  She did spiked  fever up to 102.6, encourage incentive spirometry.  Chest x-rays was  unremarkable.  Handheld nebulizer __________ treatments were started.  She got out of bed in chair with physical therapy, touchdown  weightbearing on the left.  Medicine continued follow the patient.  She  was without complaints on postop day #2. She was alert and oriented in  her usual mental status. Vital signs were stable.  She had a little bit  of decreased blood pressures and fluid boluses were given for this and  it brought it up without any difficulty.  She was continued on Coumadin  DVT prophylaxis.  The patient was a bit agitated through the weekend but  __________ frank medical difficulty.  She did drop her hemoglobin at 8.2  and 2 units of packed RBCs were transfused for this.  This increased her  __________ potential as well.  Her IV was discontinued and on 12/01/2008  she was overall doing well.  She was sitting up in the bed eating  breakfast.  She is afebrile and her vital signs were stable.  Her left  leg wound was benign.  Her dressing was changed.  There was no sign of  infection.  Her calves were soft bilaterally and nontender.  Her protime  on the day of discharge was 31.4 seconds with INR 2.8.  She was   discharged to [facility] in improved condition.  She will be on a  regular diet.  Her activity status will be a touchdown weightbearing on  left with a knee immobilizer.  I would like a knee immobilizer on her  leg at all times.   MEDICATIONS DISCHARGE:  1. Namenda 10 mg b.i.d.  2. Depakote 750 mg q.h.s.  3. Remeron 15 mg q.h.s.  4. Darvocet N 100 1 tablet q.8 h.  5. Protonix 40 mg one daily.  6. Prozac 20 mg  one daily.  7. Colace 100 mg b.i.d.  8. Trinsicon 1 tablet t.i.d.  9. Coumadin per pharmacy protocol one daily.  Her last dose was      Coumadin 2 mg but it was held on 12/05/2008.   She will need a follow-up with Dr. Luiz Blare in his office in 10 days.  The  office number is [phone].  That number should be called for an  appointment and call should there be any questions. She will need daily  physical therapy for bed to chair transfers and walker ambulation,  touchdown weightbearing on the left.  She will need OT and a home health  RN for protimes and Coumadin management.  I would like her on Coumadin  x1 month postop.      Marshia Ly, P.A.      Harvie Junior, M.D.  Electronically Signed    JB/MEDQ  D:  12/05/2008  T:  12/05/2008  Job:  [id number]   cc:   Oletta Cohn, MD  Harvie Junior, M.D.

## 2010-11-26 NOTE — Consult Note (Signed)
NAMECHYENNE, SOBCZAK NO.:  [id number]   MEDICAL RECORD NO.:  [id number]          PATIENT TYPE:  INP   LOCATION:  0454                         FACILITY:  [facility]   PHYSICIAN:  Kari Baars, M.D.  DATE OF BIRTH:  [birth year]   DATE OF CONSULTATION:  12/02/2006  DATE OF DISCHARGE:                                 CONSULTATION   REQUESTING PHYSICIAN:  Dr. Jodi Geralds.   PRIMARY PSYCHIATRIST:  Archer Asa, M.D.   REASON FOR CONSULTATION:  Postop medical management status post hip  fracture repair.   HISTORY OF PRESENT ILLNESS:  Crystal Braun is a 70 year old white  female with a history of osteopenia, longstanding mental illness with  schizotypal personality disorder, obsessive-compulsive disorder, and  probable dementia who presented to the emergency department on Dec 01, 2006, with left hip pain and inability to bear weight following a fall  one day prior.  The patient reports that she had a mechanical fall at  her assisted living facility.  She had pain and difficulty standing and  was brought to the emergency department where she was found to have a  displaced left hip fracture.  She underwent left total hip arthroplasty  by Dr. Luiz Blare last evening without complication.  The patient's history  is limited by her post anesthesia and her mental illness but she denies  any recent problems.  The emergency department nurse reported to me that  the family stated that she had been more sedated with recent medication  changes that been administered by Dr. Donell Beers.  I have the records from  [facility] but am not privy to her psychiatric history and changes.  I  have not seen her since October 2007, which was before she saw Dr.  Donell Beers.  Review of the [facility] records do indicate that she has  had a recent titration down to off of Abilify with change to Trilafon on  Nov 30, 2006.   PAST MEDICAL HISTORY:  1. 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  Her      home was condemned in December 2004, due to this behavior and she      was placed in assisted living facility point.  2. Osteopenia with DEXA scan January 2008 with a T score of -2.4 the      right hip, -1.9 at the left femoral neck.  Previously on Fosamax.  3. Probable dementia.  4. History of depression.   CURRENT MEDICATIONS:  From MAR at [facility]  1. Aspirin 81 mg daily.  2. Prozac 20 mg daily.  3. Os-Cal plus D b.i.d.  4. Namenda 10 mg b.i.d.  5. Aricept 10 mg daily,  6. Depakote ER 750 mg nightly.  7. Abilify recently decreased from 15 mg nightly to 5 mg in the      morning, 5 mg in the evening, then 5 mg q.a.m., then discontinued.  8. Trilafon 4 mg nightly  added on Nov 30, 2006.  9. Darvocet N 100.  10.Previously on Fosamax but this is not on her current MAR.   ALLERGIES:  NO KNOWN DRUG ALLERGIES.   SOCIAL HISTORY:  She is a resident of [facility].  She was previously at [facility]. [place] since 2004.  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  She quit smoking in 1998 and has  no significant alcohol use.   FAMILY HISTORY:  Father died of liver disease.  Mother died of a stroke.   REVIEW OF SYSTEMS:  Limited by the patient's current condition, but she  denies symptoms in all systems.   PHYSICAL EXAM:  VITAL SIGNS:  Temperature 98.5, blood pressure 117/55,  pulse 81-105, oxygen saturation 97% on 2 liters, respiratory rate 14.  GENERAL APPEARANCE:  She is drowsy and cachectic.  HEENT:  She appears to have a right ptosis.  No scleral icterus.  Oropharynx moist.  NECK:  Supple without lymphadenopathy, JVD or carotid bruits.  HEART:  Regular rate and rhythm without murmurs, rubs or gallops.  LUNGS:   Clear to auscultation bilaterally.  ABDOMEN:  Soft, nondistended, nontender with normoactive bowel sounds.  EXTREMITIES:  No clubbing, cyanosis or edema.  She has 2+ pulses.  Her  left hip incision is dressed without any apparent surrounding erythema,  or bloody drainage on the dressing.  NEUROLOGIC:  Alert and oriented x3.  Neuro exam not fully performed.   LABORATORY DATA:  CBC shows a white count of 9, hemoglobin 9.7 which is  decreased from preop hemoglobin of 12.4, platelets 198.  BMET  significant for sodium 129, potassium 3.9, chloride 96, bicarb 27, BUN  11, creatinine 0.6, glucose 151.  Urinalysis has 21-50 white blood cells  and 7-10 red blood cells.   ASSESSMENT/PLAN:  1. Osteoporosis resulting in left hip fracture due to a fall, status      post left total hip arthroplasty - postop management per      orthopedics.  Will obtain a 25 hydroxy vitamin D and replace as      needed.  Continue calcium and vitamin D.  Will restart Fosamax      versus consideration of changing to Reclast for compliance reasons      once she is stable.  Deep venous thrombosis prophylaxis as written      per ortho.  2. 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      Donell Beers.  Will try not to make medication adjustments as an      inpatient unless problems arise.  I will defer any changes to her      primary psychiatrist.  If psychiatric issues do arise, may need to      consider inpatient psychiatric evaluation by Dr. Jeanie Sewer.  3. Dementia - continue Aricept and Namenda.  4. Urinary tract infection - Cipro 500 mg b.i.d. for 7 days to prevent      perioperative complications in the setting of her pyuria.  5. Disposition - per orthopedics.  She may need skilled nursing     facility rehab prior to return to [facility].   I      will follow  her medical issues closely during her stay, should they      arise.      Kari Baars, M.D.  Electronically Signed    WS/MEDQ  D:  12/02/2006  T:  12/02/2006  Job:  [id number]

## 2010-11-26 NOTE — H&P (Signed)
Crystal Braun, Crystal Braun NO.:  [id number]   MEDICAL RECORD NO.:  [id number]          PATIENT TYPE:  INP   LOCATION:  1610                         FACILITY:  [facility]   PHYSICIAN:  Harvie Junior, M.D.   DATE OF BIRTH:  [birth year]   DATE OF ADMISSION:  12/01/2006  DATE OF DISCHARGE:                              HISTORY & PHYSICAL   HISTORY OF PRESENT ILLNESS:  Crystal Braun is a 70 year old female who was  in her usual state of health until she fell yesterday at her nursing  facility.  She also has been complaining of left hip pain and she was  taken to the emergency room for evaluation of left hip pain.  X-rays  were taken by Dr. Ethelda Chick in the emergency room which showed she had  a left femoral neck fracture displaced and we were consulted for  treatment of this injury.   PAST MEDICAL HISTORY:  1. Remarkable for having dementia and some schizophrenia.  2. Her past medical history is remarkable for never having had a heart      attack, never had a stroke.  3. Obsessive-compulsive disorder.   CURRENT MEDICATIONS:  1. Depakote 250 mg and 500 mg.  She takes both around 8:00 p.m. so a      total of 750 mg there.  2. Trilafon 4 mg which she takes around 8:00 p.m.  3. Fluoxetine 20 mg, one at 8:00 a.m.  4. Aspirin 81 mg daily.  5. Os-Cal 500 with vitamin D twice daily.  6. Namenda 10 mg one twice daily.  7. Aricept 10 mg one at bedtime.   MEDICAL PHYSICIAN:  Dr. Clelia Croft.  She also sees Dr. Donell Beers for her medical  issues.   SOCIAL HISTORY:  She lives at [facility].  Dr. Clelia Croft is  her primary doctor.  She does not smoke.  She is not a drinker.   ALLERGIES:  CODEINE.   FAMILY HISTORY:  Reviewed and noncontributory.   PHYSICAL EXAMINATION:  ORTHOPEDIC EXAM:  Her examination today shows  that she is neurovascularly intact in the left lower extremity.  She has  pain with all range of motion.  Right hip shows minimal pain through  range of motion.  LUNGS:  Clear to auscultation.  HEART:  Regular rate and rhythm.  ABDOMEN:  Soft, nontender.   DIAGNOSTIC STUDIES:  X-rays were taken of her hip which show she has a  displaced femoral neck fracture on the left side.  Chest x-ray shows  mild elevation of the left hemidiaphragm, no evidence of active disease.  She has a subcapital femoral neck fracture.  EKG shows normal sinus  rhythm with a right bundle branch block.   LABORATORY DATA:  White blood cell count 10, hemoglobin 12.4, hematocrit  37.4, platelet count 261.  Basic metabolic profile shows sodium 960,  potassium 143, chloride 97, carbon dioxide 30, glucose 120, BUN 10,  creatinine 0.69, calcium 9.0.  Urine is turbid with a 1.02 specific  gravity.  She has a urine hemoglobin which is large, nitrites negative,  leukocyte esterase is large.  She has  21-50 wbc's in her urine.  She has  a valproic acid level which is 19.   ASSESSMENT:  She is a 70 year old female with a history of falling who  has a displaced femoral neck fracture on her left side.   PLAN:  Our plan is to admit her and take her to the operating room for a  left hemiarthroplasty.  She will be followed by Dr. Clelia Croft and his group  in the hospital as well for medical problems.           ______________________________  Harvie Junior, M.D.     Ranae Plumber  D:  12/01/2006  T:  12/02/2006  Job:  [id number]

## 2010-11-26 NOTE — H&P (Signed)
Crystal Braun, Crystal Braun              ACCOUNT NO.:  [id number]   MEDICAL RECORD NO.:  [id number]          PATIENT TYPE:  AMB   LOCATION:                                FACILITY:  [facility]   PHYSICIAN:  Lenoard Aden, M.D.     DATE OF BIRTH:   DATE OF ADMISSION:  03/08/2007  DATE OF DISCHARGE:  03/08/2007                              HISTORY & PHYSICAL   CHIEF COMPLAINT:  CIN-3 for LEEP.   HISTORY OF PRESENT ILLNESS:  She is a 70 year old white female, G0, P0,  with a history of schizophrenic personality disorder with a high grade  abnormal Pap smear who presents for surgical intervention.   ALLERGIES:  She has allergies to CODEINE.   SOCIAL HISTORY:  She is a nonsmoker, nondrinker.  She denies domestic or  physical violence.  She is a poor historian.  Her sister qualifies as  her medical power of attorney.   FAMILY HISTORY:  She has a family history of hypertension and liver  cancer.   PAST MEDICAL HISTORY:  She has a history of broken hip.  She has a  noncontributory pregnancy history.   MEDICATIONS:  Vitamin D, Tylenol, Fosamax, aspirin, Os-Cal,  nitroglycerin, Abilify, Depakote, Aricept, and Namenda.   PHYSICAL EXAMINATION:  She is a well-developed, well-nourished white  female in no apparent distress.  HEENT:  Normal.  LUNGS:  Clear.  HEART:  Regular rhythm.  ABDOMEN:  Soft, nontender.  The patient is 5 feet 5 inches, 120 pounds.   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   PLAN:  Plan is to proceed with LEEP.  This procedure is discussed with  the patient's sister for consent.  Risks of anesthesia, infection,  bleeding, intra-abdominal __________  were discussed.  Complications  include bowel and bladder injury __________ .  The patient acknowledges  and wishes to proceed.      Lenoard Aden, M.D.  Electronically Signed     RJT/MEDQ  D:  03/07/2007  T:  03/07/2007  Job:  [id number]   cc:   Lenoard Aden, M.D.  Fax: [phone]

## 2010-11-26 NOTE — Consult Note (Signed)
Crystal Braun, Crystal Braun              ACCOUNT NO.:  [id number]   MEDICAL RECORD NO.:  [id number]           PATIENT TYPE:   LOCATION:                                 FACILITY:   PHYSICIAN:  Theresia Majors. Tanda Rockers, M.D.DATE OF BIRTH:  11-Jan-1941   DATE OF CONSULTATION:  12/14/2007  DATE OF DISCHARGE:                                 CONSULTATION   SUBJECTIVE:  Ms. Laforest is a 70 year old female who returns for  followup of a pressure ulcer of the right sacral area.  In the interim,  she has been bathing the area with antibacterial soap and utilizing  saline irrigation with a plain new gauze rib and packing.  There has  been no interim fever, malodor, or pain.  She is returning for  evaluation.  She is accompanied by her sister and an aid.  There has  been no interim hospitalizations or medication changes.  She continues  on a pressure reducing mattress.  Her appetite has been good.   OBJECTIVE:  Blood pressure is 116/69, respirations 16, pulse rate 84,  and temperature 97.  The patient was examined from the left lateral  decubitus position.  The sacral wound, wound #1 has dramatically  decreased.  There is no drainage, hyperemia, or malodor.  The orifice is  punctate measuring approximately 4-5 cm.  There is minimum depth.  There  is a new wound, wound #2 on the right buttock and represents a stage I  blister pressure ulceration.  There is no bleeding.  No debridement is  needed.   ASSESSMENT:  Clinical improvement.   PLAN:  We will continue daily antibacterial soap washes to avoid  excessive moisture and/or fecal or urinary contamination.  We will  continued offloading mattress and attention to nutrition as per the  facility.  We will re-evaluate the patient in one month to assess her  continued response to therapy.      [name], M.D.  Electronically Signed     HAN/MEDQ  D:  12/14/2007  T:  12/15/2007  Job:  [id number]

## 2010-11-26 NOTE — Op Note (Signed)
NAMEGENOVA, KINER              ACCOUNT NO.:  [id number]   MEDICAL RECORD NO.:  [id number]          PATIENT TYPE:  INP   LOCATION:  3027                         FACILITY:  [facility]   PHYSICIAN:  Harvie Junior, M.D.   DATE OF BIRTH:  [birth year]   DATE OF PROCEDURE:  11/29/2008  DATE OF DISCHARGE:                               OPERATIVE REPORT   PREOPERATIVE NOTE:  Distal femoral fracture with butterfly comminution  and a spiral that goes close to a prosthesis.   POSTOPERATIVE DIAGNOSIS:  Distal femoral fracture with butterfly  comminution and a spiral that goes close to a prosthesis.   PROCEDURE:  Open reduction and internal fixation of femur fracture with  a plate up around the periprosthetic stem.   SURGEON:  Harvie Junior, MD   ASSISTANT:  Marshia Ly, PA   ANESTHESIA:  General.   BRIEF HISTORY:  Ms. Digangi is a 70 year old female with a long history,  who had a hemiarthroplasty 2 years ago.  She fell and suffered distal  femoral fracture, butterfly comminution.  We initially evaluated her and  felt that intramedullary rodding would not give [place] purchase up around  the stem and we were concerned about the stress riser in that area, so  we ultimately elected to do an open plating with some percutaneous  component up around the stem, and she was brought to the operating room  for this procedure.   PROCEDURE:  The patient brought to the operating room.  After adequate  anesthesia was obtained, the patient placed supine on the operating  table and then moved in the right lateral decubitus position.  All bony  prominences were well padded and axillary roll was put in place.  Following this, the leg was exsanguinated and a sterile tourniquet was  applied.  Tourniquet was inflated to 350 mmHg.  We then made a lateral  incision from the knee at about the mid shaft of the femur and we  dissected down to the area of the fracture.  There was significant  amounts of bleeding and  it felt like this was really more of a venous  tourniquet, so we went ahead and took the tourniquet off at that point.  Bleeding did kind of calm down a little bit, but there was still plenty  of oozing from the muscle area over the lateral approach of the femur.  Once this was approached, we did held the femur in anatomically reduced  position and then took a 16-hole plate and placed it close to the bone  to get excellent purchase.  I then filled some holes with locking and  nonlocking screws distally under fluoroscopic guidance.  We then went up  and had to deal with percutaneous nature of the case in the  periprosthetic area.  We got 3 unicortical periprosthetic screws.  Unfortunately, we were so perfectly aligned that we were to go bounding  right into the stem and we could not sneak around the stem and we were  hopeful that we might be able to do that, but we just not able to do it.  Once we locked up proximally, we did come back down, put 2 D[place]  cables around the fractured area where the butterfly comminution was to  help get a purchase of the comminuted pieces.  At this point,  fluoroscopic images were taken.  We had an near-anatomic reduction with  good cable placement and screw fixation, and at that point with both  locking and nonlocking screws.  At this point, the wound was copiously  and thoroughly irrigated and suctioned dry and the wound was closed with  1 Vicryl running and skin with 0 and 2-0 Vicryl and skin staples.  A  very complex case.  There was a significant 2 hours of preoperative  planning, then went into this case relative to thoughts about  intramedullary rodding versus plating versus the periprosthetic issues.  The case itself typically femoral plating is about a 90-minute case,  this was about a 2-1/2 hour case just relative to the difficult nature  of the plating system in the periprosthetic area.  Ultimately, the  result is excellent and the patient did  lose probably closer to 1000 mL  of blood during the case and she was given 2 units of blood during the  case.  We will check her in the recovery room and continue to support  her hemodynamically in the postoperative.  Gus Puma assisted  throughout the case.  There was critical in the maintenance of reduction  and placement of screws and closure to save OR time.  Ultimately, the  wound was copiously and thoroughly irrigated, closed as outlined, and a  sterile compressive dressing was applied as well as knee immobilizer.  The patient was taken to the recovery room, and she was noted to be in  satisfactory condition.   Estimated blood loss would be estimated about 1000 mL.      Harvie Junior, M.D.  Electronically Signed     JLG/MEDQ  D:  11/29/2008  T:  11/30/2008  Job:  [id number]

## 2010-11-26 NOTE — Discharge Summary (Signed)
NAMEEMALEE, KNIES              ACCOUNT NO.:  [id number]   MEDICAL RECORD NO.:  [id number]          PATIENT TYPE:  INP   LOCATION:  5525                         FACILITY:  [facility]   PHYSICIAN:  Tera Mater. Evlyn Kanner, M.D. DATE OF BIRTH:  1940-11-07   DATE OF ADMISSION:  09/24/2007  DATE OF DISCHARGE:  09/29/2007                               DISCHARGE SUMMARY   FINAL DIAGNOSES:  Are as follows:  1. Bilateral aspiration pneumonia with sepsis syndrome.  2. Anemia of chronic disease.  3. Hypernatremia, now resolved.  4. History of dementia.  5. History of schizoaffective disorder, stable.  6. Gastroesophageal reflux disease, stable.  7. Obsessive compulsive disorder.  8. Depression.   PROCEDURES:  Included a swallowing study which she passed here.  They  recommended a dysphagia III diet.   Ms. Illingworth is a 70 year old white female, longstanding patient at  [facility], a patient primarily of Dr. Sandra Cockayne,  who presented for evaluation on September 24, 2007.  I saw her at that time  and found her to be severely ill with hypoxia and sepsis with bilateral  pneumonia.  This was a very rapid onset and it appeared to be due to a  bacterial cause, probably aspiration pneumonia.  The patient initially  was treated with vigorous fluid hydration, with improvement, and covered  with steroids.  She has done by fairly well during her hospitalization.  She was somewhat slow to improve her blood pressure but this is now  doing extremely well.  It is unclear whether the steroid therapy was  important in the blood pressure improvement but the patient, again, has  done well.  Today she is 117/74 and has been holding well.  She had no  fever in 72 hours.  Her blood cultures are not specified in any  particular organism and her oxygenation is good.  Based on this and her  routine laboratory studies, I think we can complete out the IV  antibiotics as an outpatient at the nursing home.   The patient has PICC  line placed so access should not be a problem.  The patient has no  complaints at the present time.   LAB DATA:  Chest x-ray on September 25, 2007 showed aeration of the lower  lobes has been unchanged, persistent right effusion.  On September 24, 2007  there was a PICC line in the SVC, progression of right lower lobe  pneumonia.  On September 24, 2007 before that, stable elevation of the left  hemidiaphragm, left lower lobe atelectasis, infiltrate, right infrahilar  opacity concerning for pneumonia.  A swallowing study was as noted  above.  Other laboratory data:  Today's chemistries:  Sodium 142,  potassium 3.7, chloride 111, CO2 26, BUN 16, creatinine 0.57, glucose  106, SGOT 17, SGPT is 19, total protein 4.6, albumin is 1.5, calcium is  8.  White count today is 12,100, hemoglobin 11.8, platelets 165,000.  Blood culture showed diphtheroids only which is contaminant.  A urine  culture was negative.  BNP on September 26, 2007 was 574.   ADDITIONAL PROCEDURES:  The  patient did receive 2 units of packed cells  I should note.  Lactic acid was elevated on September 25, 2007 at 3.5.  C.  diff was negative.  BNP at presentation was 706, D-dimer at presentation  was 3.65.  A troponin was less than 0.05.  Initial white count was 9000,  hemoglobin 8.6, platelets 187,000.  Initial creatinine was 1.3.  Sodium  was 147, potassium 3.9, chloride 88, BUN 38.  Urine showed 0-2 white  cells at presentation.   In summary, we have a 70 year old white female, a longstanding patient  at [facility], presenting acutely ill with pneumonia and a sepsis  syndrome.  Fortunately, the patient has done very well and has  clinically improved to a point I think she can get back to the nursing  home.  Since we do not have a particular organism, I am going to drop  the vancomycin off of her antibiotics and we are going to continue her  Zosyn 3.375 mg q.8 h for an additional 5 days.  She will also continue  on  Lovenox 40 mg daily subcu for the additional 7-10 days to be  determined by her mobility status.  She is on a prednisone taper and she  will actually be on 10 mg daily for 10 days, 5 mg daily for 10 days, 5  every other day for 10 days, and then stop.  We will check a morning  cortisol after that.  She is on Protonix 40 mg p.o. daily.  She is on  Prozac 20 mg p.o. daily.  She is on Iron Complex 150 mg twice daily,  Namenda 10 mg twice daily, Remeron 15 mg at bedtime, Aricept 10 mg at  bedtime, Depakote 750 mg at bedtime, Trilafon 2 mg at bedtime and 81 mg  aspirin.  It is important to note that she got both influenza A and  pneumococcal vaccine while here.  Her diet is a dysphagia III diet with  thin liquids per recommendation.  There is no caloric restriction on  that.  She will not be back for the moment on her Colace, her Megace, or  her calcium and we will gradually put those back on.  She will need  physical and occupational therapy for strengthening.  She has been a do  not resuscitate while here and will continue as such at the nursing  home.           ______________________________  Tera Mater. Evlyn Kanner, M.D.     SAS/MEDQ  D:  09/29/2007  T:  09/29/2007  Job:  [id number]

## 2010-11-27 NOTE — Telephone Encounter (Signed)
It is very difficult for the pt to come in the office due to her mental and physical impairement.  I should  talk to the sister who makes all the decisions, Please call the [facility] to give an order to reduce Protonix to bid dose, and leave the tel. # for me to call  [name].

## 2010-11-28 MED ORDER — PANTOPRAZOLE SODIUM 40 MG PO TBEC: 40.0000 mg | DELAYED_RELEASE_TABLET | Freq: Two times a day (BID) | ORAL | Status: DC

## 2010-11-28 NOTE — Telephone Encounter (Signed)
I have sent a new order for Protonix twice daily dosing to [facility] and I have also spoken to Haivana Nakya, one of the staff members who helps care for Crystal Braun to advise her of the change in Protonix dosing. The number on file for [name], Ms. Bagheri caregiver is [phone] (I have confirmed this number with [facility] as well).

## 2010-11-29 NOTE — Telephone Encounter (Signed)
Left voicemail for Crystal Braun that Dr Juanda Chance was indeed trying to contact her about endoscopy but that we had since gotten word that she would rather her sister not have the endoscopy at this time since she is doing well on the pantoprazole. I have explained that this would be fine as long as patient was doing better and that we have decreased her pantoprazole to bid dosing rather than tid dosing as this should be sufficient for her. I have asked Crystal Braun to call back if she changes her mind about the endoscopy or if she has any further questions.

## 2010-11-29 NOTE — Telephone Encounter (Signed)
I tried to cal;l her again, and left a message for [name] to tell us when to call her to reach her on her mobile phone.

## 2010-11-29 NOTE — Telephone Encounter (Signed)
Crystal Braun states that she will be available to speak on Sunday evening.

## 2010-12-13 HISTORY — PX: EYE SURGERY: SHX253

## 2010-12-31 ENCOUNTER — Emergency Department (HOSPITAL_COMMUNITY)
Admission: EM | Admit: 2010-12-31 | Discharge: 2010-12-31 | Disposition: A | Discharge status: Home / Self Care | Payer: Medicare Other | Attending: Emergency Medicine | Admitting: Emergency Medicine

## 2010-12-31 DIAGNOSIS — K623 Rectal prolapse: Secondary | ICD-10-CM | POA: Insufficient documentation

## 2010-12-31 DIAGNOSIS — G309 Alzheimer's disease, unspecified: Secondary | ICD-10-CM | POA: Insufficient documentation

## 2010-12-31 DIAGNOSIS — K6289 Other specified diseases of anus and rectum: Secondary | ICD-10-CM | POA: Insufficient documentation

## 2010-12-31 DIAGNOSIS — E039 Hypothyroidism, unspecified: Secondary | ICD-10-CM | POA: Insufficient documentation

## 2010-12-31 DIAGNOSIS — F0280 Dementia in other diseases classified elsewhere without behavioral disturbance: Secondary | ICD-10-CM | POA: Insufficient documentation

## 2010-12-31 DIAGNOSIS — Z66 Do not resuscitate: Secondary | ICD-10-CM | POA: Insufficient documentation

## 2011-01-03 ENCOUNTER — Telehealth: Payer: Self-pay | Admitting: Internal Medicine

## 2011-01-03 NOTE — Telephone Encounter (Signed)
Received a call from [place] Gideon(patient's sister). This week the nursing home sent patient to the hospital for a rectal prolapse. She was seen in ER and told it was fine it had gone back in. The next night, the nursing home said it happened again. The extender at the nursing home said patient needs to see a surgeon. Ms. Amalia Hailey wants to know if Dr. Juanda Chance would want to see her or should she be seen by a surgeon.( She is aware Dr. Juanda Chance is not in the office next week.) Please, advise.

## 2011-01-04 NOTE — Telephone Encounter (Signed)
Iresommend surgical eval. Since it is a surgical problem

## 2011-01-06 NOTE — Telephone Encounter (Signed)
I have left a message for the patient's sister [name] relaying Dr Regino Schultze recommendations.  She is asked to call back for any questions.

## 2011-02-11 ENCOUNTER — Encounter (INDEPENDENT_AMBULATORY_CARE_PROVIDER_SITE_OTHER): Payer: Self-pay | Admitting: Surgery

## 2011-02-11 ENCOUNTER — Ambulatory Visit (INDEPENDENT_AMBULATORY_CARE_PROVIDER_SITE_OTHER): Payer: Medicare Other | Admitting: Surgery

## 2011-02-11 VITALS — BP 118/66 | HR 68 | Temp 97.0°F | Ht 68.0 in | Wt 180.8 lb

## 2011-02-11 DIAGNOSIS — K5900 Constipation, unspecified: Secondary | ICD-10-CM

## 2011-02-11 DIAGNOSIS — K623 Rectal prolapse: Secondary | ICD-10-CM

## 2011-02-11 DIAGNOSIS — K5909 Other constipation: Secondary | ICD-10-CM

## 2011-02-11 DIAGNOSIS — F0390 Unspecified dementia without behavioral disturbance: Secondary | ICD-10-CM

## 2011-02-11 HISTORY — DX: Rectal prolapse: K62.3

## 2011-02-11 NOTE — Progress Notes (Addendum)
Subjective:     Patient ID: Crystal Braun, female   DOB: 1940/10/13, 70 y.o.   MRN: [id number]  HPI  Patient is a resident of a skilled facility. She has chronic dementia. She struggles with intermittent constipation and diarrhea. She had an episode of diarrhea last year that lasted months. This year is noted that she had episodes of prolapse of her out of her rectum. She has been to the emergency room for this. Spontaneously reduced. She has a very large bowel movements off and the size of an orange or a grapefruit. They usually are soft but very well-formed and firm. Tried stool softeners but then she will get diarrhea.  Because these episodes have happen more often she discussed with her gastroenterologist. Dr. Juanda Chance recommend consideration of surgical evaluation. Patient history for colon polyps. She had a large cecal polyp removed piecemeal fashion last year. She is due for a f/u colonoscopy in November.  The patient was wheelchair-bound for several years. However after her hip surgery 2 years ago, she's been getting more therapy. She is at a new skilled facility. She is walking well with a walker. She has her sister with her.  She also has a caretaker that has helped in making progress with the patient. The patient's activity level is much improved.  Review of Systems  Unable to perform ROS: Dementia  Constitutional: Negative for fever, chills, diaphoresis, appetite change and fatigue.  HENT: Negative for ear pain, sore throat, trouble swallowing, neck pain and ear discharge.   Eyes: Negative for photophobia, discharge and visual disturbance.  Respiratory: Negative for cough, choking, chest tightness and shortness of breath.   Cardiovascular: Negative for chest pain and palpitations.  Gastrointestinal: Positive for constipation. Negative for abdominal pain.  Genitourinary: Positive for enuresis. Negative for dysuria, frequency and difficulty urinating.       Urinary incontinence    Musculoskeletal: Negative for myalgias and gait problem.  Skin: Negative for color change and pallor.  Neurological: Negative for dizziness, speech difficulty, weakness and numbness.  Hematological: Negative for adenopathy.  Psychiatric/Behavioral: Negative for confusion and agitation. The patient is not nervous/anxious.        Objective:   Physical Exam  Constitutional: She is oriented to person, place, and time. She appears well-developed and well-nourished. No distress.  HENT:  Head: Normocephalic.  Mouth/Throat: Oropharynx is clear and moist. No oropharyngeal exudate.  Eyes: Conjunctivae and EOM are normal. Pupils are equal, round, and reactive to light. No scleral icterus.  Neck: Normal range of motion. Neck supple. No tracheal deviation present.  Cardiovascular: Normal rate, regular rhythm and intact distal pulses.   Pulmonary/Chest: Effort normal and breath sounds normal. No respiratory distress. She exhibits no tenderness.  Abdominal: Soft. She exhibits no distension and no mass. There is no tenderness. Hernia confirmed negative in the right inguinal area and confirmed negative in the left inguinal area.  Genitourinary: Vagina normal. No vaginal discharge found.       Incont urine.  Decreased rectal tone but intact.  Large stool ball in rectum.  No masses/hemorrhoids/fissure/fistula.  No prolapse in office, but mucosa lax  Musculoskeletal: Normal range of motion. She exhibits no tenderness.  Lymphadenopathy:    She has no cervical adenopathy.       Right: No inguinal adenopathy present.       Left: No inguinal adenopathy present.  Neurological: She is alert and oriented to person, place, and time. No cranial nerve deficit. She exhibits normal muscle tone. Coordination normal.  Skin: Skin is warm and dry. No rash noted. She is not diaphoretic. No erythema.  Psychiatric: Her speech is normal. Her affect is blunt. Her affect is not labile. She is withdrawn. Cognition and memory  are impaired.       Answers "No!" for every question.  Follows commands.  Flat affect       Assessment:     Rectal prolapse by history & ED visits. Chronic constipation. Dementia but with some functioning ability.    Plan:     In this instance, this will not get better without surgery. She does have dementia, but she has a pretty good quality of life. She has a good care. She has made improvement in her physical therapy. The sister and caregiver wish for [place] to be aggressive.  I think she is a reasonable candidate for laparoscopic surgery. On CAT scan she has a lot of redundant sigmoid colon. I think that contributes markedly to this problem. I recommended sigmoid colectomy. I discussed using biologic mesh rectopexy and suture rectopexy as well. This would involve several hours of surgery. She would need to stay in the hospital for least week, perhaps longer to given her dementia and other health issues.  I also discussed the option of just improving the bowel regimen in reducing her prolapse as tolerated. However because they have attempted this & bowels are better, the family is skeptical that the work.  The anatomy & physiology of the digestive tract was discussed.  The pathophysiology of rectal prolapse was discussed.  Natural history risks without surgery was discussed.   I feel the risks of no intervention will lead to serious problems that outweigh the operative risks; therefore, I recommended surgery to treat the pathology.  Possible need for sigmoid colectomy to remove redundant colon was discussed.  Pexy by suture and probable mesh reinforcement was discussed as well.  Laparoscopic & open techniques were discussed.   Risks such as bleeding, infection, abscess, leak, reoperation, possible ostomy, hernia, heart attack, death, and other risks were discussed.  Goals of post-operative recovery were discussed as well.  We will work to minimize complications.  An educational handout on the  technique was given as well.  Questions were answered.  The patient expresses understanding & wishes to proceed with surgery.   I will get cardiac clearance preoperatively. I think given the recent large polyp that was removed, I think a followup colonoscopy preoperatively would be seen in good to make sure there is no other problems before putting her through an operation. Once these things are done and we will plan surgery.  On discussion of risks and benefits including death, heart attack, stroke, leak and bleeding and other problems. Family wishes to be aggressive & agrees.

## 2011-02-12 ENCOUNTER — Telehealth: Payer: Self-pay | Admitting: *Deleted

## 2011-02-12 DIAGNOSIS — K2270 Barrett's esophagus without dysplasia: Secondary | ICD-10-CM

## 2011-02-12 NOTE — Telephone Encounter (Signed)
Dr Juanda Chance received a note from Dr Michaell Cowing regarding Crystal Braun need for colonoscopy before consideration of rectal prolapse surgery. Dr Juanda Chance has stated that she would like Crystal Braun to have a DIRECT endo (for follow up of esophageal ulcer) and colonoscopy. Procedure will have to be completed at the hospital due to Crystal Braun being wheelchair bound. Procedure has been scheduled for 03/27/11 @ 9:30 am. I have left a message for Crystal Braun's caregiver, Haskell Riling to call back to discuss. Crystal Braun's 03/07/11 office visit may ben cancelled.

## 2011-02-12 NOTE — Telephone Encounter (Signed)
Dr Brodie received a note from Dr Gross regarding patient need for colonoscopy before consideration of rectal prolapse surgery. Dr Brodie has stated that she would like patient to have a DIRECT endo (for follow up of esophageal ulcer) and colonoscopy. Procedure will have to be completed at the hospital due to patient being wheelchair bound. Procedure has been scheduled for 03/27/11 @ 9:30 am. I have left a message for patient's caregiver, Marilyn Gideon to call back to discuss. Patient's 03/07/11 office visit may ben cancelled.   

## 2011-02-13 NOTE — Telephone Encounter (Signed)
Thank You for arranging that. Please remind me of pt's admission  The weekend before. DB

## 2011-02-13 NOTE — Telephone Encounter (Signed)
I have spoken to Ms Amalia Hailey and have advised her that we will have patient prepped in the hospital just as we did last time. I have explained that patient should be admitted before noon on 03-26-11 and that I would be calling her to let her know when a bed is available. I have also asked that Ms Amalia Hailey be certain to be present for patient's procedures on 03-27-11 at 9:30 am. She should bring POA papers and insurance cards with her. Ms Dario Guardian understanding.

## 2011-02-13 NOTE — Telephone Encounter (Signed)
Ms. Amalia Hailey has been advised that we will schedule patient for DIRECT endoscopy/colonoscopy on 03/27/11 at [facility] with propofol. Ms Amalia Hailey states that patient actually prepped in the hospital last time due to dementia and incontinence. Dr. Juanda Chance, I have tentivally reserved a room for Ms. Mearns for 24 hour observation/prep @ [facility] on 03/26/11 before noon (spoke with Selena Batten in bed placement on 02/13/11 @11 :25 am). Is this okay with you?

## 2011-03-03 ENCOUNTER — Encounter: Payer: Self-pay | Admitting: Cardiovascular Disease

## 2011-03-04 ENCOUNTER — Encounter: Payer: Self-pay | Admitting: Cardiovascular Disease

## 2011-03-04 ENCOUNTER — Ambulatory Visit (INDEPENDENT_AMBULATORY_CARE_PROVIDER_SITE_OTHER): Payer: Medicare Other | Admitting: Cardiovascular Disease

## 2011-03-04 VITALS — BP 114/72 | HR 78 | Ht 68.0 in | Wt 190.0 lb

## 2011-03-04 DIAGNOSIS — Z01810 Encounter for preprocedural cardiovascular examination: Secondary | ICD-10-CM | POA: Insufficient documentation

## 2011-03-04 NOTE — Assessment & Plan Note (Signed)
She has no chest pain, SOB or other signs or symptoms of angina, congestive heart failure. She has no prior cardiac history. Her only problem is hyperlipidemia. Based on current ACC/AHA guidelines, no further cardiac workup is needed at this time. Would proceed to surgery as planned.

## 2011-03-04 NOTE — Progress Notes (Signed)
History of Present Illness:70 yo WF with h/o hyperlipidemia, Alzheimers dementia, schizophrenia, obsessive compulsive disorder and rectal prolapse here today for cardiac evaluation prior to planned surgical procedure for rectal prolapse. She has had multiple left hip and leg fractures and has been confined to a wheelchair but is now using a walker. She has been doing well. No chest pain, SOB, palpitations, near syncope or syncope. She walks around her apartment. Very little activity. She does exercise every morning at her home. No prior cardiac problems. NO family h/o CAD.   Her primary care doctor is Florentina Jenny with [facility].   Past Medical History  Diagnosis Date  . Broken femur 2009    2 broken bones below knee, (mose)  . Weight gain   . Pneumonia   . Incontinence   . Hyperlipidemia   . Alzheimer disease   . Personal history of colonic polyps   . Diverticulosis of colon (without mention of hemorrhage)   . Thyroid disease     hypothroidism  . Special screening for malignant neoplasms, colon   . Obsessive-compulsive disorders   . Esophageal reflux   . Osteoporosis   . Major depressive disorder, recurrent episode, moderate   . Anemia, unspecified   . Unspecified schizophrenia, unspecified condition     Past Surgical History  Procedure Date  . Joint replacement 2007 or 2008    left hip  . Eye surgery 12/2010    cataract  . Cervix excision     Loupe electrical surgical excision of the cervix  . Hemiarthroplasty hip     Left Crystal Braun  . Orif femur fracture- liss plate     Current Outpatient Prescriptions  Medication Sig Dispense Refill  . acetaminophen (TYLENOL) 500 MG tablet Take 500 mg by mouth every 8 (eight) hours.        Marland Kitchen alendronate (FOSAMAX) 35 MG tablet Take 35 mg by mouth every 7 (seven) days. Take with a full glass of water on an empty stomach.       Marland Kitchen aspirin 81 MG tablet Take 81 mg by mouth daily.        . Cholecalciferol (VITAMIN  D PO) Take by mouth daily.        . Cranberry 250 MG CAPS Take by mouth daily.        Tery Sanfilippo Calcium (STOOL SOFTENER PO) Take by mouth as needed.        . hydrocortisone 1 % cream Apply topically 2 (two) times daily.        . memantine (NAMENDA) 10 MG tablet Take 10 mg by mouth daily.        . Menthol-Zinc Oxide (CALMOSEPTINE) 0.44-20.625 % OINT Apply topically as needed.        . pantoprazole (PROTONIX) 40 MG tablet Take 1 tablet (40 mg total) by mouth 2 (two) times daily.  60 tablet  4  . QUEtiapine (SEROQUEL) 50 MG tablet Take 50 mg by mouth at bedtime.        . sertraline (ZOLOFT) 50 MG tablet Take 50 mg by mouth daily.          Allergies  Allergen Reactions  . Codeine     Patient unsure of reaction as of appointment.  . Latex     rash    History   Social History  . Marital Status: Single    Spouse Name: N/A    Number of Children: N/A  . Years of Education: N/A   Occupational  History  . Retired     Ship broker   Social History Main Topics  . Smoking status: Former Smoker    Quit date: 07/14/1996  . Smokeless tobacco: Not on file  . Alcohol Use: No  . Drug Use: No  . Sexually Active: Not on file   Other Topics Concern  . Not on file   Social History Narrative   Sister [name] gideon is POA    Family History  Problem Relation Age of Onset  . Stroke Mother   . Cancer Mother     Breast   . Cancer Father     liver    Review of Systems:  As stated in the HPI and otherwise negative.   BP 114/72  Pulse 78  Ht 5\' 8"  (1.727 m)  Wt 190 lb (86.183 kg)  BMI 28.89 kg/m2  Physical Examination: General: Well developed, well nourished, NAD HEENT: OP clear, mucus membranes moist SKIN: warm, dry. No rashes. Neuro: No focal deficits Musculoskeletal: Muscle strength 5/5 all ext Psychiatric: Mood and affect normal Neck: No JVD, no carotid bruits, no thyromegaly, no lymphadenopathy. Lungs:Clear bilaterally, no wheezes, rhonci, crackles Cardiovascular:  Regular rate and rhythm. No murmurs, gallops or rubs. Abdomen:Soft. Bowel sounds present. Non-tender.  Extremities: No lower extremity edema. Pulses are 2 + in the bilateral DP/PT.  EKG:NSR, rate 78 bpm. IVCD.

## 2011-03-07 ENCOUNTER — Ambulatory Visit: Payer: Medicare Other | Admitting: Internal Medicine

## 2011-03-14 ENCOUNTER — Telehealth: Payer: Self-pay | Admitting: *Deleted

## 2011-03-14 NOTE — Telephone Encounter (Signed)
Spoke with patient's caregiver, [name] and discussed admission. She verbalizes understanding.

## 2011-03-14 NOTE — Telephone Encounter (Signed)
Spoke with pt's sister and she said Crystal Braun was to be admitted to the hospital for prepping of her colon and that she was told not to bring her to the previsit on 03/14/11.  She would like you to call her and verify the time she's to be admitted.    [name]

## 2011-03-26 ENCOUNTER — Telehealth: Payer: Self-pay | Admitting: *Deleted

## 2011-03-26 ENCOUNTER — Telehealth: Payer: Self-pay | Admitting: Internal Medicine

## 2011-03-26 ENCOUNTER — Observation Stay (HOSPITAL_COMMUNITY)
Admission: RE | Admit: 2011-03-26 | Discharge: 2011-03-27 | Disposition: A | Discharge status: Home / Self Care | Payer: Medicare Other | Source: Ambulatory Visit | Attending: Internal Medicine | Admitting: Internal Medicine

## 2011-03-26 ENCOUNTER — Observation Stay (HOSPITAL_COMMUNITY): Payer: Medicare Other

## 2011-03-26 DIAGNOSIS — K219 Gastro-esophageal reflux disease without esophagitis: Secondary | ICD-10-CM | POA: Insufficient documentation

## 2011-03-26 DIAGNOSIS — R9431 Abnormal electrocardiogram [ECG] [EKG]: Secondary | ICD-10-CM | POA: Insufficient documentation

## 2011-03-26 DIAGNOSIS — E039 Hypothyroidism, unspecified: Secondary | ICD-10-CM | POA: Insufficient documentation

## 2011-03-26 DIAGNOSIS — K209 Esophagitis, unspecified without bleeding: Secondary | ICD-10-CM | POA: Insufficient documentation

## 2011-03-26 DIAGNOSIS — K5730 Diverticulosis of large intestine without perforation or abscess without bleeding: Secondary | ICD-10-CM | POA: Insufficient documentation

## 2011-03-26 DIAGNOSIS — K2270 Barrett's esophagus without dysplasia: Secondary | ICD-10-CM | POA: Insufficient documentation

## 2011-03-26 DIAGNOSIS — E785 Hyperlipidemia, unspecified: Secondary | ICD-10-CM | POA: Insufficient documentation

## 2011-03-26 DIAGNOSIS — Z860100 Personal history of colon polyps, unspecified: Secondary | ICD-10-CM | POA: Insufficient documentation

## 2011-03-26 DIAGNOSIS — D126 Benign neoplasm of colon, unspecified: Principal | ICD-10-CM | POA: Insufficient documentation

## 2011-03-26 DIAGNOSIS — Z7982 Long term (current) use of aspirin: Secondary | ICD-10-CM | POA: Insufficient documentation

## 2011-03-26 DIAGNOSIS — Z79899 Other long term (current) drug therapy: Secondary | ICD-10-CM | POA: Insufficient documentation

## 2011-03-26 DIAGNOSIS — D649 Anemia, unspecified: Secondary | ICD-10-CM | POA: Insufficient documentation

## 2011-03-26 LAB — COMPREHENSIVE METABOLIC PANEL
ALT: 12 U/L (ref 0–35)
AST: 14 U/L (ref 0–37)
Calcium: 9.8 mg/dL (ref 8.4–10.5)
Creatinine, Ser: 0.96 mg/dL (ref 0.50–1.10)
GFR calc Af Amer: >60 mL/min (ref >60)
Sodium: 136 mEq/L (ref 135–145)
Total Protein: 7.3 g/dL (ref 6.0–8.3)

## 2011-03-26 LAB — CBC
MCH: 29 pg (ref 26.0–34.0)
MCHC: 31.9 g/dL (ref 30.0–36.0)
MCV: 90.9 fL (ref 78.0–100.0)
Platelets: 270 10*3/uL (ref 150–400)

## 2011-03-26 LAB — IRON AND TIBC
Iron: 72 ug/dL (ref 42–135)
TIBC: 355 ug/dL (ref 250–470)
UIBC: 283 ug/dL (ref 125–400)

## 2011-03-26 LAB — PROTIME-INR: INR: 1 (ref 0.00–1.49)

## 2011-03-26 NOTE — Telephone Encounter (Signed)
Advised sister once again that I have been contacting bed placement and they still do not have an available bed. I have again advised her that I will call her as soon as I get a bed.

## 2011-03-26 NOTE — Telephone Encounter (Signed)
I have spoken to [place], patient's caregiver and have advised her that per Selena Batten in bed placement, patient is to go to [facility] as they now have a bed for her at [address]. Lanora Manis in endoscopy is aware as is Willette Cluster, NP.

## 2011-03-27 ENCOUNTER — Encounter: Payer: Medicare Other | Admitting: Internal Medicine

## 2011-03-27 ENCOUNTER — Other Ambulatory Visit: Payer: Self-pay | Admitting: Internal Medicine

## 2011-03-27 DIAGNOSIS — R198 Other specified symptoms and signs involving the digestive system and abdomen: Secondary | ICD-10-CM

## 2011-03-27 DIAGNOSIS — K222 Esophageal obstruction: Secondary | ICD-10-CM

## 2011-03-27 DIAGNOSIS — Z86010 Personal history of colonic polyps: Secondary | ICD-10-CM

## 2011-03-27 DIAGNOSIS — D490 Neoplasm of unspecified behavior of digestive system: Secondary | ICD-10-CM

## 2011-03-27 DIAGNOSIS — D126 Benign neoplasm of colon, unspecified: Secondary | ICD-10-CM

## 2011-03-27 DIAGNOSIS — K2270 Barrett's esophagus without dysplasia: Secondary | ICD-10-CM

## 2011-03-27 LAB — TSH: TSH: 2.662 u[IU]/mL (ref 0.350–4.500)

## 2011-03-30 ENCOUNTER — Encounter: Payer: Self-pay | Admitting: Internal Medicine

## 2011-04-07 LAB — I-STAT 8, (EC8 V) (CONVERTED LAB)
BUN: 38 — ABNORMAL HIGH
Bicarbonate: 12.8 — ABNORMAL LOW
Glucose, Bld: 88
Hemoglobin: 9.2 — ABNORMAL LOW
Sodium: 147 — ABNORMAL HIGH
pH, Ven: 7.348 — ABNORMAL HIGH

## 2011-04-07 LAB — CULTURE, BLOOD (ROUTINE X 2)

## 2011-04-07 LAB — COMPREHENSIVE METABOLIC PANEL
ALT: 19
AST: 20
AST: 17
AST: 19
Albumin: 1.8 — ABNORMAL LOW
Albumin: 1.5 — ABNORMAL LOW
Albumin: 1.5 — ABNORMAL LOW
Albumin: 1.7 — ABNORMAL LOW
Albumin: 1.8 — ABNORMAL LOW
Alkaline Phosphatase: 44
Alkaline Phosphatase: 52
BUN: 29 — ABNORMAL HIGH
BUN: 25 — ABNORMAL HIGH
BUN: 27 — ABNORMAL HIGH
BUN: 30 — ABNORMAL HIGH
CO2: 23
CO2: 26
Calcium: 8 — ABNORMAL LOW
Chloride: 110
Chloride: 118 — ABNORMAL HIGH
Creatinine, Ser: 0.92
Creatinine, Ser: 0.79
GFR calc Af Amer: >60
GFR calc Af Amer: >60
GFR calc Af Amer: >60
GFR calc non Af Amer: >60
GFR calc non Af Amer: >60
Glucose, Bld: 130 — ABNORMAL HIGH
Potassium: 3.4 — ABNORMAL LOW
Potassium: 3.7
Sodium: 142
Sodium: 149 — ABNORMAL HIGH
Total Bilirubin: 0.5
Total Bilirubin: 0.6
Total Protein: 5.3 — ABNORMAL LOW
Total Protein: 5.3 — ABNORMAL LOW

## 2011-04-07 LAB — CBC
HCT: 25.5 — ABNORMAL LOW
HCT: 26.3 — ABNORMAL LOW
HCT: 30.6 — ABNORMAL LOW
HCT: 30 — ABNORMAL LOW
HCT: 30.3 — ABNORMAL LOW
HCT: 33.7 — ABNORMAL LOW
Hemoglobin: 11.3 — ABNORMAL LOW
MCHC: 32.9
MCHC: 33.2
MCHC: 33.2
MCV: 89.8
MCV: 89.8
MCV: 90.5
MCV: 89.3
Platelets: 154
Platelets: 174
Platelets: 133 — ABNORMAL LOW
Platelets: 138 — ABNORMAL LOW
RBC: 2.93 — ABNORMAL LOW
RBC: 3.77 — ABNORMAL LOW
RBC: 3.97
RDW: 17.4 — ABNORMAL HIGH
RDW: 17.9 — ABNORMAL HIGH
RDW: 17.4 — ABNORMAL HIGH
WBC: 9
WBC: 12.8 — ABNORMAL HIGH
WBC: 13.2 — ABNORMAL HIGH
WBC: 13.7 — ABNORMAL HIGH

## 2011-04-07 LAB — CLOSTRIDIUM DIFFICILE EIA

## 2011-04-07 LAB — DIFFERENTIAL
Basophils Relative: 0
Eosinophils Absolute: 0
Eosinophils Relative: 0
Lymphs Abs: 0.6 — ABNORMAL LOW
Monocytes Relative: 5
Neutrophils Relative %: 88 — ABNORMAL HIGH

## 2011-04-07 LAB — URINE CULTURE
Colony Count: NO GROWTH
Culture: NO GROWTH

## 2011-04-07 LAB — CROSSMATCH
ABO/RH(D): O POS
Antibody Screen: NEGATIVE

## 2011-04-07 LAB — POCT CARDIAC MARKERS
Myoglobin, poc: 168
Operator id: 285841
Troponin i, poc: <0.05

## 2011-04-07 LAB — URINE MICROSCOPIC-ADD ON

## 2011-04-07 LAB — IRON AND TIBC
Iron: 85
TIBC: 163 — ABNORMAL LOW

## 2011-04-07 LAB — URINALYSIS, ROUTINE W REFLEX MICROSCOPIC
Hgb urine dipstick: NEGATIVE
Nitrite: NEGATIVE
Protein, ur: NEGATIVE
Specific Gravity, Urine: 1.029
Urobilinogen, UA: 1

## 2011-04-07 LAB — POCT I-STAT CREATININE: Creatinine, Ser: 1.3 — ABNORMAL HIGH

## 2011-04-07 LAB — RETICULOCYTES: Retic Count, Absolute: 35.6

## 2011-04-10 ENCOUNTER — Telehealth: Payer: Self-pay | Admitting: *Deleted

## 2011-04-10 ENCOUNTER — Telehealth (INDEPENDENT_AMBULATORY_CARE_PROVIDER_SITE_OTHER): Payer: Self-pay

## 2011-04-10 NOTE — Telephone Encounter (Signed)
LMOM after pt's guardian left me a message about holding off on surgery for now b/c the pt is not having any problems. I advised that we wouldn't do anything without talking to them per Dr Michaell Cowing this is not an emegency. We can just wait to hear from them when they decide to schedule surgery they can just call our office.Hulda Humphrey

## 2011-04-10 NOTE — Telephone Encounter (Signed)
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 Call sister with appt.

## 2011-04-10 NOTE — Telephone Encounter (Signed)
Left a message for patient's caregiver to call me. 

## 2011-04-11 NOTE — Telephone Encounter (Signed)
Actually I would wait with the CT scan since she is seeing Dr Michaell Cowing for  Possible rectal prolapse surgery and the CT scan could be done while she is in the hospital. Guy Sandifer may remind that to Dr gross.

## 2011-04-11 NOTE — Telephone Encounter (Signed)
Spoke with patient's sister [name]. Patient is due for repeat CT abdomen and pelvis to monitor pancreatic cyst( 1 year f/u). Patient's sister states this is very difficult for the patient and would like to know if this has to be done.Please, advise.

## 2011-04-11 NOTE — Telephone Encounter (Signed)
Left a message for patient's sister to call me.

## 2011-04-14 NOTE — Telephone Encounter (Signed)
It is OK, consider repeating the CT scan in March 2013. Which will be 1 year from the last evaluation.

## 2011-04-14 NOTE — Telephone Encounter (Signed)
Spoke with patient's sister and she states the surgery has been placed on hold for now since the patient has not had anymore problems and Dr. Michaell Cowing told her it was not an emergency to have it. She states her sister has been through some much this summer that she wanted to give her a break for now.

## 2011-04-14 NOTE — Discharge Summary (Signed)
NAMEAMERI, [name]              ACCOUNT NO.:  [id number]  MEDICAL RECORD NO.:  [id number]  LOCATION:  1321                         FACILITY:  [facility]  PHYSICIAN:  Hedwig Morton. Juanda Chance, MD     DATE OF BIRTH:  Sep 22, 1940  DATE OF ADMISSION:  03/26/2011 DATE OF DISCHARGE:  03/27/2011                              DISCHARGE SUMMARY   DISCHARGING PHYSICIAN:  Hedwig Morton. Juanda Chance, MD  PRIMARY GASTROENTEROLOGIST:  Hedwig Morton. Juanda Chance, MD  DISPOSITION:  Home in stable condition.  DISCHARGE MEDICATIONS:  The patient will continue her home medications including, 1. Alendronate 35 mg 1 tablet by mouth weekly. 2. Aspirin 81 mg 1 tablet daily. 3. Citrucel one-half scoop by mouth daily. 4. Cranberry tablet 250 mg daily. 5. Docusate 100 mg 1 capsule by mouth daily as needed. 6. Loperamide 2 mg 1 capsule every 8 hours as needed. 7. Tylenol 500 mg 1 tablet every 8 hours as needed. 8. Calmoseptine ointment apply to rash topically twice daily asneeded. 9. Hydrocortisone 1% cream apply topically to affected area twice     daily. 10.Memantine 10 mg 1 tablet daily. 11.Sertraline 50 mg by mouth daily. 12.Seroquel 50 mg daily at bedtime. 13.Vitamin D3, 1000 units daily. 14.Pantoprazole 40 mg twice daily.  CONSULTATIONS:  None requested.  PROCEDURES PERFORMED: 1. Upper endoscopy with biopsies for history of Barrett esophagus.     Procedure done with MAC sedation. 2. Colonoscopy with polypectomy by Dr. Juanda Chance.  Procedure done with     MAC sedation.  DISCHARGE DIAGNOSES: 1. Rectal prolapse, for surgery with Dr. Karie Soda in the near     future. 2. History of adenomatous polyps in 2011.  The patient had 2 polyps     removed this admission, biopsies pending at time of discharge. 3. Barrett esophagus. 4. Alzheimer's disease. 5. Diverticulosis. 6. Hypothyroidism. 7. Obsessive-compulsive disorder. 8. Osteoporosis. 9. Depression.  HOSPITAL COURSE:  Crystal Braun is a 70 year old female, followed by  Dr. Juanda Chance in our office for a history of adenomatous polyps and Barrett esophagus.  Patient will be having surgery for a rectal prolapse and her surgeon Dr. Michaell Cowing requested colonoscopy prior to surgery. The patient is wheelchair bound, has a history of dementia, and needed assistance with bowel prep, so she was admitted to the hospital on February 23, 2011.  Plan was for an upper endoscopy to be done at the same time for Barrett esophagus surveillance.  The patient was admitted to Dr. Regino Schultze service to a medical bed at [facility].  Admission labs showed a normal white count of 8.6, normal hemoglobin 12.7, MCV normal at 90.9, and platelets normal at 270.  Her INR was normal at 1.0.  Electrolytes and liver function studies were normal. BUN and creatinine were normal as well.  Chest x-ray negative for any acute abnormalities. The patient began her bowel prepp prep upon arrival to the hospital.  The following morning, she went for EGD and colonoscopy.  EGD findings pertinent for Barrett esophagus, biopsy is pending at the time discharge.  Colonoscopy pertinent for diverticulosis and a 13mm and 15mm right-sided colon polyp, biopsies pending at time of discharge. The patient tolerated the procedures well and the next morning she  was discharged home.  Our office would notify the patient about her biopsy results, she could follow up with Dr. Juanda Chance in the office as needed.     Crystal Cluster, NP   ______________________________ Hedwig Morton. Juanda Chance, MD    PG/MEDQ  D:  04/10/2011  T:  04/10/2011  Job:  [id number]  Electronically Signed by Crystal Cluster NP on 04/14/2011 09:40:25 AM Electronically Signed by Crystal Sar MD on 04/14/2011 04:48:57 PM

## 2011-04-25 LAB — SAMPLE TO BLOOD BANK

## 2011-04-25 LAB — CBC
HCT: 39
Hemoglobin: 13.2
MCHC: 33.9
MCV: 91.9
Platelets: 268
RBC: 4.25
RDW: 14.3 — ABNORMAL HIGH
WBC: 9.7

## 2011-09-12 ENCOUNTER — Telehealth: Payer: Self-pay | Admitting: *Deleted

## 2011-09-12 DIAGNOSIS — K862 Cyst of pancreas: Secondary | ICD-10-CM

## 2011-09-12 NOTE — Telephone Encounter (Signed)
Spoke with patient's sister. She states getting her sister to drink contrast will be an issue. She states she thinks the patient was admitted last time she had a CT. Dr. Juanda Chance, does she have to be admitted to have the CT? Please, advise.

## 2011-09-12 NOTE — Telephone Encounter (Signed)
Please order without oral contrast, only IV contrast. No admission necessary, it takes 15 min to do the CT scan

## 2011-09-12 NOTE — Telephone Encounter (Signed)
Scheduled patient for CT abd, pelvis without oral contrast, IV contrast only at [facility] radiology on 10/02/11 at 12:30 PM. NPO 4 hours prior to CT. [name] Labs in [place] prior to CT at our office. Spoke with patient's sister [name] and gave her appointment and instructions.

## 2011-09-12 NOTE — Telephone Encounter (Signed)
Message copied by Daphine Deutscher on Fri Sep 12, 2011 10:29 AM ------      Message from: Daphine Deutscher      Created: Mon Apr 14, 2011  1:07 PM       Repeat CT abd, pelvis. Call her sister to schedule

## 2011-09-18 ENCOUNTER — Other Ambulatory Visit (INDEPENDENT_AMBULATORY_CARE_PROVIDER_SITE_OTHER): Payer: Medicare Other

## 2011-09-18 ENCOUNTER — Telehealth: Payer: Self-pay | Admitting: Internal Medicine

## 2011-09-18 DIAGNOSIS — K862 Cyst of pancreas: Secondary | ICD-10-CM

## 2011-09-18 DIAGNOSIS — K863 Pseudocyst of pancreas: Secondary | ICD-10-CM

## 2011-09-18 LAB — BUN: BUN: 13 mg/dL (ref 6–23)

## 2011-09-18 NOTE — Telephone Encounter (Signed)
Ms. Crystal Braun requests labs and CT result be sent to [facility]- phone [phone] fax [phone]

## 2011-10-02 ENCOUNTER — Encounter (HOSPITAL_COMMUNITY): Payer: Self-pay

## 2011-10-02 ENCOUNTER — Ambulatory Visit (HOSPITAL_COMMUNITY)
Admission: RE | Admit: 2011-10-02 | Discharge: 2011-10-02 | Disposition: A | Discharge status: Home / Self Care | Payer: Medicare Other | Source: Ambulatory Visit | Attending: Internal Medicine | Admitting: Internal Medicine

## 2011-10-02 DIAGNOSIS — K862 Cyst of pancreas: Secondary | ICD-10-CM | POA: Insufficient documentation

## 2011-10-02 MED ORDER — IOHEXOL 300 MG/ML  SOLN
100.0000 mL | Freq: Once | INTRAMUSCULAR | Status: AC | PRN
  Administered 2011-10-02: 100 mL via INTRAVENOUS

## 2011-10-03 NOTE — Telephone Encounter (Signed)
Faxed results.

## 2013-02-15 ENCOUNTER — Encounter: Payer: Self-pay | Admitting: *Deleted

## 2013-02-21 ENCOUNTER — Encounter: Payer: Self-pay | Admitting: Internal Medicine

## 2013-09-30 ENCOUNTER — Encounter: Payer: Self-pay | Admitting: Internal Medicine

## 2016-02-04 ENCOUNTER — Encounter: Payer: Self-pay | Admitting: Gastroenterology

## 2017-01-02 ENCOUNTER — Emergency Department (HOSPITAL_COMMUNITY): Payer: Medicare Other

## 2017-01-02 ENCOUNTER — Inpatient Hospital Stay (HOSPITAL_COMMUNITY): Payer: Medicare Other

## 2017-01-02 ENCOUNTER — Inpatient Hospital Stay (HOSPITAL_COMMUNITY)
Admission: EM | Admit: 2017-01-02 | Discharge: 2017-01-04 | DRG: 189 | Disposition: A | Discharge status: Skilled Nursing Facility | Payer: Medicare Other | Attending: Family Medicine | Admitting: Family Medicine

## 2017-01-02 ENCOUNTER — Encounter (HOSPITAL_COMMUNITY): Payer: Self-pay | Admitting: Emergency Medicine

## 2017-01-02 DIAGNOSIS — J9601 Acute respiratory failure with hypoxia: Secondary | ICD-10-CM | POA: Diagnosis not present

## 2017-01-02 DIAGNOSIS — G309 Alzheimer's disease, unspecified: Secondary | ICD-10-CM | POA: Diagnosis not present

## 2017-01-02 DIAGNOSIS — Z96642 Presence of left artificial hip joint: Secondary | ICD-10-CM | POA: Diagnosis present

## 2017-01-02 DIAGNOSIS — R531 Weakness: Secondary | ICD-10-CM

## 2017-01-02 DIAGNOSIS — Z87891 Personal history of nicotine dependence: Secondary | ICD-10-CM

## 2017-01-02 DIAGNOSIS — Z79899 Other long term (current) drug therapy: Secondary | ICD-10-CM | POA: Diagnosis not present

## 2017-01-02 DIAGNOSIS — Z66 Do not resuscitate: Secondary | ICD-10-CM | POA: Diagnosis present

## 2017-01-02 DIAGNOSIS — F0280 Dementia in other diseases classified elsewhere without behavioral disturbance: Secondary | ICD-10-CM | POA: Diagnosis not present

## 2017-01-02 DIAGNOSIS — J189 Pneumonia, unspecified organism: Secondary | ICD-10-CM | POA: Diagnosis present

## 2017-01-02 DIAGNOSIS — R9431 Abnormal electrocardiogram [ECG] [EKG]: Secondary | ICD-10-CM | POA: Diagnosis not present

## 2017-01-02 DIAGNOSIS — Z91040 Latex allergy status: Secondary | ICD-10-CM | POA: Diagnosis not present

## 2017-01-02 DIAGNOSIS — J9811 Atelectasis: Secondary | ICD-10-CM | POA: Diagnosis present

## 2017-01-02 DIAGNOSIS — Z885 Allergy status to narcotic agent status: Secondary | ICD-10-CM

## 2017-01-02 DIAGNOSIS — F209 Schizophrenia, unspecified: Secondary | ICD-10-CM | POA: Diagnosis present

## 2017-01-02 DIAGNOSIS — M810 Age-related osteoporosis without current pathological fracture: Secondary | ICD-10-CM | POA: Diagnosis present

## 2017-01-02 DIAGNOSIS — F429 Obsessive-compulsive disorder, unspecified: Secondary | ICD-10-CM | POA: Diagnosis not present

## 2017-01-02 DIAGNOSIS — N390 Urinary tract infection, site not specified: Secondary | ICD-10-CM | POA: Diagnosis present

## 2017-01-02 DIAGNOSIS — Z86010 Personal history of colonic polyps: Secondary | ICD-10-CM

## 2017-01-02 DIAGNOSIS — E785 Hyperlipidemia, unspecified: Secondary | ICD-10-CM | POA: Diagnosis present

## 2017-01-02 DIAGNOSIS — Z7983 Long term (current) use of bisphosphonates: Secondary | ICD-10-CM

## 2017-01-02 DIAGNOSIS — Z7982 Long term (current) use of aspirin: Secondary | ICD-10-CM | POA: Diagnosis not present

## 2017-01-02 HISTORY — DX: Weakness: R53.1

## 2017-01-02 HISTORY — DX: Urinary tract infection, site not specified: N39.0

## 2017-01-02 HISTORY — DX: Acute respiratory failure with hypoxia: J96.01

## 2017-01-02 LAB — URINALYSIS, MICROSCOPIC (REFLEX)

## 2017-01-02 LAB — BASIC METABOLIC PANEL
ANION GAP: 9 (ref 5–15)
BUN: 10 mg/dL (ref 6–20)
CALCIUM: 8.5 mg/dL — AB (ref 8.9–10.3)
CO2: 31 mmol/L (ref 22–32)
Chloride: 98 mmol/L — ABNORMAL LOW (ref 101–111)
Creatinine, Ser: 1 mg/dL (ref 0.44–1.00)
GFR calc Af Amer: >60 mL/min (ref >60)
GFR calc non Af Amer: 53 mL/min — ABNORMAL LOW (ref >60)
GLUCOSE: 97 mg/dL (ref 65–99)
Potassium: 4 mmol/L (ref 3.5–5.1)
Sodium: 138 mmol/L (ref 135–145)

## 2017-01-02 LAB — URINALYSIS, ROUTINE W REFLEX MICROSCOPIC
Bilirubin Urine: NEGATIVE
GLUCOSE, UA: NEGATIVE mg/dL
Ketones, ur: NEGATIVE mg/dL
Nitrite: POSITIVE — AB
PH: 6 (ref 5.0–8.0)
Protein, ur: NEGATIVE mg/dL
Specific Gravity, Urine: 1.025 (ref 1.005–1.030)

## 2017-01-02 LAB — TROPONIN I: Troponin I: <0.03 ng/mL (ref <0.03)

## 2017-01-02 LAB — CBC
HEMATOCRIT: 34.8 % — AB (ref 36.0–46.0)
HEMOGLOBIN: 10.7 g/dL — AB (ref 12.0–15.0)
MCH: 28.2 pg (ref 26.0–34.0)
MCHC: 30.7 g/dL (ref 30.0–36.0)
MCV: 91.6 fL (ref 78.0–100.0)
Platelets: 176 10*3/uL (ref 150–400)
RBC: 3.8 MIL/uL — ABNORMAL LOW (ref 3.87–5.11)
RDW: 16.3 % — AB (ref 11.5–15.5)
WBC: 4.8 10*3/uL (ref 4.0–10.5)

## 2017-01-02 LAB — BRAIN NATRIURETIC PEPTIDE: B NATRIURETIC PEPTIDE 5: 155.3 pg/mL — AB (ref 0.0–100.0)

## 2017-01-02 LAB — VALPROIC ACID LEVEL: VALPROIC ACID LVL: 69 ug/mL (ref 50.0–100.0)

## 2017-01-02 LAB — I-STAT TROPONIN, ED: TROPONIN I, POC: 0 ng/mL (ref 0.00–0.08)

## 2017-01-02 MED ORDER — PANTOPRAZOLE SODIUM 40 MG PO TBEC: 40.0000 mg | DELAYED_RELEASE_TABLET | Freq: Two times a day (BID) | ORAL | Status: DC

## 2017-01-02 MED ORDER — IOPAMIDOL (ISOVUE-370) INJECTION 76%
INTRAVENOUS | Status: AC
  Administered 2017-01-02: 100 mL
  Filled 2017-01-02: qty 100

## 2017-01-02 MED ORDER — CALCIUM CARBONATE-VITAMIN D 500-200 MG-UNIT PO TABS
1.0000 | ORAL_TABLET | Freq: Every day | ORAL | Status: DC
  Administered 2017-01-03 – 2017-01-04 (×2): 1 via ORAL
  Filled 2017-01-02 (×2): qty 1

## 2017-01-02 MED ORDER — LOPERAMIDE HCL 2 MG PO CAPS: 2.0000 mg | ORAL_CAPSULE | Freq: Three times a day (TID) | ORAL | Status: DC | PRN

## 2017-01-02 MED ORDER — DEXTROSE 5 % IV SOLN
1.0000 g | Freq: Once | INTRAVENOUS | Status: AC
  Administered 2017-01-02: 1 g via INTRAVENOUS
  Filled 2017-01-02: qty 10

## 2017-01-02 MED ORDER — CEFTRIAXONE SODIUM 1 G IJ SOLR
1.0000 g | INTRAMUSCULAR | Status: DC
  Administered 2017-01-03: 1 g via INTRAVENOUS
  Filled 2017-01-02 (×2): qty 10

## 2017-01-02 MED ORDER — ALENDRONATE SODIUM 35 MG PO TABS: 35.0000 mg | ORAL_TABLET | ORAL | Status: DC

## 2017-01-02 MED ORDER — LORAZEPAM 0.5 MG PO TABS
0.5000 mg | ORAL_TABLET | Freq: Three times a day (TID) | ORAL | Status: DC | PRN
  Administered 2017-01-03: 0.5 mg via ORAL
  Filled 2017-01-02: qty 1

## 2017-01-02 MED ORDER — ACETAMINOPHEN 325 MG PO TABS: 650.0000 mg | ORAL_TABLET | ORAL | Status: DC | PRN

## 2017-01-02 MED ORDER — FUROSEMIDE 10 MG/ML IJ SOLN
20.0000 mg | Freq: Every day | INTRAMUSCULAR | Status: DC
  Administered 2017-01-03: 20 mg via INTRAVENOUS
  Filled 2017-01-02 (×2): qty 2

## 2017-01-02 MED ORDER — DIVALPROEX SODIUM 250 MG PO DR TAB
500.0000 mg | DELAYED_RELEASE_TABLET | Freq: Two times a day (BID) | ORAL | Status: DC
  Administered 2017-01-03 – 2017-01-04 (×4): 500 mg via ORAL
  Filled 2017-01-02 (×4): qty 2

## 2017-01-02 MED ORDER — FUROSEMIDE 10 MG/ML IJ SOLN
20.0000 mg | Freq: Once | INTRAMUSCULAR | Status: DC
  Filled 2017-01-02: qty 2

## 2017-01-02 MED ORDER — ONDANSETRON HCL 4 MG/2ML IJ SOLN: 4.0000 mg | Freq: Four times a day (QID) | INTRAMUSCULAR | Status: DC | PRN

## 2017-01-02 MED ORDER — KETOCONAZOLE 2 % EX SHAM
1.0000 "application " | MEDICATED_SHAMPOO | Freq: Every day | CUTANEOUS | Status: DC
  Administered 2017-01-03 – 2017-01-04 (×2): 1 via TOPICAL
  Filled 2017-01-02: qty 120

## 2017-01-02 MED ORDER — ASPIRIN EC 325 MG PO TBEC
325.0000 mg | DELAYED_RELEASE_TABLET | Freq: Every day | ORAL | Status: DC
  Administered 2017-01-03 – 2017-01-04 (×2): 325 mg via ORAL
  Filled 2017-01-02 (×2): qty 1

## 2017-01-02 MED ORDER — MELATONIN 3 MG PO TABS
3.0000 mg | ORAL_TABLET | Freq: Every day | ORAL | Status: DC
  Administered 2017-01-03 (×2): 3 mg via ORAL
  Filled 2017-01-02 (×3): qty 1

## 2017-01-02 MED ORDER — MEMANTINE HCL 10 MG PO TABS
10.0000 mg | ORAL_TABLET | Freq: Every day | ORAL | Status: DC
  Administered 2017-01-03 – 2017-01-04 (×2): 10 mg via ORAL
  Filled 2017-01-02 (×2): qty 1

## 2017-01-02 MED ORDER — ASPIRIN 81 MG PO TABS: 81.0000 mg | ORAL_TABLET | Freq: Every day | ORAL | Status: DC

## 2017-01-02 MED ORDER — GI COCKTAIL ~~LOC~~: 30.0000 mL | Freq: Four times a day (QID) | ORAL | Status: DC | PRN

## 2017-01-02 MED ORDER — ACETAMINOPHEN 500 MG PO TABS
500.0000 mg | ORAL_TABLET | Freq: Three times a day (TID) | ORAL | Status: DC
  Administered 2017-01-03 – 2017-01-04 (×5): 500 mg via ORAL
  Filled 2017-01-02 (×5): qty 1

## 2017-01-02 MED ORDER — SERTRALINE HCL 50 MG PO TABS
50.0000 mg | ORAL_TABLET | Freq: Every day | ORAL | Status: DC
  Administered 2017-01-03 – 2017-01-04 (×2): 50 mg via ORAL
  Filled 2017-01-02 (×2): qty 1

## 2017-01-02 NOTE — ED Provider Notes (Signed)
[facility] DEPT Provider Note   CSN: [id number] Arrival date & time: 01/02/17  1733     History   Chief Complaint Chief Complaint  Patient presents with  . Chest Pain    HPI Crystal Braun is a 76 y.o. female.  The history is provided by the patient, medical records and a relative (sister). No language interpreter was used.  Chest Pain   This is a new problem. The current episode started 1 to 2 hours ago. The problem occurs rarely. The problem has been resolved. The pain is associated with rest. The pain is present in the substernal region. The quality of the pain is described as brief. The pain does not radiate. Duration of episode(s) is 30 minutes. Pertinent negatives include no abdominal pain, no back pain, no cough, no fever, no palpitations, no shortness of breath and no vomiting.  Pertinent negatives for past medical history include no seizures.    Past Medical History:  Diagnosis Date  . Alzheimer disease   . Anemia, unspecified   . Broken femur ([place]) 2009   2 broken bones below knee, ([place])  . Diverticulosis of colon (without mention of hemorrhage)   . Esophageal reflux   . Hyperlipidemia   . Incontinence   . Major depressive disorder, recurrent episode, moderate ([place])   . Obsessive-compulsive disorders   . Osteoporosis   . Personal history of colonic polyps   . Pneumonia   . Special screening for malignant neoplasms, colon   . Thyroid disease    hypothroidism  . Unspecified schizophrenia, unspecified condition   . Weight gain     Patient Active Problem List   Diagnosis Date Noted  . Acute respiratory failure with hypoxia ([place]) 01/02/2017  . Acute lower UTI 01/02/2017  . Weakness generalized 01/02/2017  . Obsessive-compulsive disorder   . Pre-operative cardiovascular examination 03/04/2011  . Complete rectal prolapse with no displacement of anal muscles 02/11/2011  . NONSPECIFIC ABN FINDING RAD & OTH EXAM GI TRACT 07/17/2010  . HYPOTHYROIDISM  07/16/2010  . DIVERTICULAR DISEASE 07/16/2010  . COLONIC POLYPS, HX OF 07/16/2010  . UNSPECIFIED ANEMIA 05/06/2010  . Schizophrenia ([place]) 05/06/2010  . MAJOR DPRSV DISORDER RECURRENT EPISODE MODERATE 05/06/2010  . OBSESSION 05/06/2010  . Alzheimer's disease 05/06/2010  . GERD 05/06/2010  . OSTEOPOROSIS 05/06/2010    Past Surgical History:  Procedure Laterality Date  . cervix excision     [place] electrical surgical excision of the cervix  . EYE SURGERY  12/2010   cataract  . HEMIARTHROPLASTY HIP     Left [place]  . JOINT REPLACEMENT  2007 or 2008   left hip  . ORIF FEMUR FRACTURE- LISS PLATE      OB History    No data available       Home Medications    Prior to Admission medications   Medication Sig Start Date End Date Taking? Authorizing Provider  acetaminophen (TYLENOL) 500 MG tablet Take 500 mg by mouth every 8 (eight) hours.     Yes [provider]  Calcium Carb-Cholecalciferol (CALCIUM-VITAMIN D) 600-400 MG-UNIT TABS Take 1 tablet by mouth daily.   Yes [provider]  Cholecalciferol (VITAMIN D PO) Take 2,000 Units by mouth daily.    Yes [provider]  Cranberry 250 MG CAPS Take by mouth daily.     Yes [provider]  divalproex (DEPAKOTE) 250 MG DR tablet Take 500 mg by mouth 2 (two) times daily.   Yes [provider]  ketoconazole (  NIZORAL) 2 % shampoo Apply 1 application topically daily.   Yes [provider]  loperamide (IMODIUM) 2 MG capsule Take 2 mg by mouth 3 (three) times daily as needed for diarrhea or loose stools.   Yes [provider]  LORazepam (ATIVAN) 0.5 MG tablet Take 0.5 mg by mouth 3 (three) times daily as needed for anxiety.   Yes [provider]  Melatonin 3 MG TABS Take 3 mg by mouth at bedtime.   Yes [provider]  memantine (NAMENDA) 10 MG tablet Take 10 mg by mouth daily.     Yes [provider]  Menthol-Zinc Oxide (CALMOSEPTINE) 0.44-20.625 %  OINT Apply topically as needed.     Yes [provider]  metroNIDAZOLE (METROGEL) 0.75 % gel Apply 1 application topically daily.   Yes [provider]  sertraline (ZOLOFT) 50 MG tablet Take 50 mg by mouth daily.     Yes [provider]  zinc oxide 20 % ointment Apply 1 application topically as needed for irritation.   Yes [provider]  alendronate (FOSAMAX) 35 MG tablet Take 35 mg by mouth every 7 (seven) days. Take with a full glass of water on an empty stomach.     [provider]  aspirin 81 MG tablet Take 81 mg by mouth daily.      [provider]  Docusate Calcium (STOOL SOFTENER PO) Take by mouth as needed.      [provider]  hydrocortisone 1 % cream Apply topically 2 (two) times daily.      [provider]  pantoprazole (PROTONIX) 40 MG tablet Take 1 tablet (40 mg total) by mouth 2 (two) times daily. Patient not taking: Reported on 01/02/2017 11/28/10   Lafayette Dragon, MD  QUEtiapine (SEROQUEL) 50 MG tablet Take 50 mg by mouth at bedtime.      [provider]    Family History Family History  Problem Relation Age of Onset  . Stroke Mother   . Breast cancer Mother   . Heart failure Mother   . Liver cancer Father     Social History Social History  Substance Use Topics  . Smoking status: Former Smoker    Quit date: 07/14/1996  . Smokeless tobacco: Not on file  . Alcohol use No     Allergies   Codeine and Latex   Review of Systems Review of Systems  Constitutional: Positive for fatigue. Negative for chills and fever.  HENT: Negative for ear pain and sore throat.   Eyes: Negative for pain and visual disturbance.  Respiratory: Negative for cough and shortness of breath.   Cardiovascular: Positive for chest pain and leg swelling. Negative for palpitations.  Gastrointestinal: Negative for abdominal pain and vomiting.  Genitourinary: Negative for dysuria and hematuria.  Musculoskeletal:  Negative for arthralgias and back pain.  Skin: Negative for color change and rash.  Neurological: Negative for seizures and syncope.  All other systems reviewed and are negative.    Physical Exam Updated Vital Signs BP 99/76 (BP Location: Right Arm)   Pulse 79   Temp 98.3 F (36.8 C) (Oral)   Resp (!) 22   Ht 5\' 8"  (1.727 m)   Wt 80.2 kg (176 lb 11.2 oz)   SpO2 96%   BMI 26.87 kg/m   Physical Exam  Constitutional: She appears well-developed.  HENT:  Head: Normocephalic and atraumatic.  Eyes: Conjunctivae are normal.  Neck: Neck supple.  Cardiovascular: Normal rate and regular rhythm.  No murmur heard. Pulmonary/Chest: Effort normal and breath sounds normal. No respiratory distress. She has no wheezes. She has no rales.  New oxygen requirement of 2L to maintain SpO2>90%  Abdominal: Soft. There is no tenderness.  Musculoskeletal: She exhibits edema (2+ BLE edema).  Neurological: She is alert. No cranial nerve deficit. Coordination normal.  5/5 motor strength and intact sensation in all extremities. Finger-to-nose intact bilaterally  Skin: Skin is warm and dry.  Nursing note and vitals reviewed.    ED Treatments / Results  Labs (all labs ordered are listed, but only abnormal results are displayed) Labs Reviewed  BASIC METABOLIC PANEL - Abnormal; Notable for the following:       Result Value   Chloride 98 (*)    Calcium 8.5 (*)    GFR calc non Af Amer 53 (*)    All other components within normal limits  CBC - Abnormal; Notable for the following:    RBC 3.80 (*)    Hemoglobin 10.7 (*)    HCT 34.8 (*)    RDW 16.3 (*)    All other components within normal limits  URINALYSIS, ROUTINE W REFLEX MICROSCOPIC - Abnormal; Notable for the following:    APPearance HAZY (*)    Hgb urine dipstick TRACE (*)    Nitrite POSITIVE (*)    Leukocytes, UA SMALL (*)    All other components within normal limits  URINALYSIS, MICROSCOPIC (REFLEX) - Abnormal; Notable for the following:     Bacteria, UA FEW (*)    Squamous Epithelial / LPF 0-5 (*)    All other components within normal limits  BRAIN NATRIURETIC PEPTIDE - Abnormal; Notable for the following:    B Natriuretic Peptide 155.3 (*)    All other components within normal limits  CULTURE, BLOOD (ROUTINE X 2)  CULTURE, BLOOD (ROUTINE X 2)  URINE CULTURE  VALPROIC ACID LEVEL  TROPONIN I  TROPONIN I  TROPONIN I  I-STAT TROPOININ, ED    EKG  EKG Interpretation  Date/Time:  Friday January 02 2017 17:47:03 EDT Ventricular Rate:  86 PR Interval:    QRS Duration: 123 QT Interval:  378 QTC Calculation: 453 R Axis:   103 Text Interpretation:  Sinus rhythm Probable left atrial enlargement Nonspecific intraventricular conduction delay Probable anteroseptal infarct, old Confirmed by Tanna Furry [phone]) on 01/02/2017 6:14:26 PM       Radiology Dg Chest 2 View  Result Date: 01/02/2017 CLINICAL DATA:  Pt BIB EMS from [facility] for CP. Per EMS pt was walking to dinner at 1630 and started c/o CP; resolved with 324 ASA PTA. Pt oriented to person and place; Resp e/u; NAD noted at this time. Hx of pneumonia. EXAM: CHEST  2 VIEW COMPARISON:  03/26/2011 FINDINGS: There is significant elevation of left hemidiaphragm, stable from the prior study. Streaky and hazy opacity at the right lung base is noted, new from the prior exam. This may reflect atelectasis or infection, the former favored. The remainder of the lungs is clear. No convincing pleural effusion. No pneumothorax. Cardiac silhouette is mildly enlarged. No convincing mediastinal or hilar masses. Skeletal structures are demineralized, grossly intact. IMPRESSION: 1. Streaky and hazy right lung base opacity, most likely atelectasis. Pneumonia should be considered if there are consistent clinical symptoms. 2. No other evidence of acute cardiopulmonary disease. No pulmonary edema. Electronically Signed   By: Lajean Manes M.D.   On: 01/02/2017 18:31   Ct Angio Chest Pe W  And/or Wo Contrast  Result Date: 01/02/2017  CLINICAL DATA:  76 year old female with shortness of breath. EXAM: CT ANGIOGRAPHY CHEST WITH CONTRAST TECHNIQUE: Multidetector CT imaging of the chest was performed using the standard protocol during bolus administration of intravenous contrast. Multiplanar CT image reconstructions and MIPs were obtained to evaluate the vascular anatomy. CONTRAST:  100 cc Isovue 370 COMPARISON:  Chest radiograph dated 01/02/2017 FINDINGS: Evaluation of the study is limited due to streak artifact caused by patient's arms. Cardiovascular: There is no cardiomegaly or pericardial effusion. The thoracic aorta is unremarkable. Evaluation of the pulmonary arteries is limited due to streak artifact caused by patient's arms and suboptimal opacification of the peripheral branches. No central pulmonary artery embolus identified. Mediastinum/Nodes: There is no hilar or mediastinal adenopathy. Esophagus is grossly unremarkable. No mediastinal fluid collection or hematoma. 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 There is consolidative changes of the left lung base, which may represent compressive atelectasis or infiltrate. Clinical correlation is recommended. Right lung base atelectatic changes/scarring noted. There is a small left pleural effusion. No pneumothorax. The central airways are patent. There is compression of the left lower lobe bronchus by the mass effect caused by elevated left hemidiaphragm and abdominal content. Upper Abdomen: There is colonic diverticulosis. The visualized upper abdomen is otherwise unremarkable. Musculoskeletal: There is degenerative changes of the spine. No acute fracture. Review of the MIP images confirms the above findings. IMPRESSION: 1. No CT evidence of central pulmonary artery embolus. 2. 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 There is consolidative changes of  the left lung base which may represent postobstructive or compressive atelectasis. Infiltrate is not excluded. Clinical correlation is recommended. 3. Small left pleural effusion. Electronically Signed   By: Anner Crete M.D.   On: 01/02/2017 23:17    Procedures Procedures (including critical care time)  Medications Ordered in ED Medications  furosemide (LASIX) injection 20 mg (0 mg Intravenous Hold 01/02/17 2045)  sertraline (ZOLOFT) tablet 50 mg (not administered)  memantine (NAMENDA) tablet 10 mg (not administered)  acetaminophen (TYLENOL) tablet 500 mg (not administered)  calcium-vitamin D (OSCAL WITH D) 500-200 MG-UNIT per tablet 1 tablet (not administered)  divalproex (DEPAKOTE) DR tablet 500 mg (not administered)  Melatonin TABS 3 mg (3 mg Oral Given 01/03/17 0027)  loperamide (IMODIUM) capsule 2 mg (not administered)  LORazepam (ATIVAN) tablet 0.5 mg (not administered)  ketoconazole (NIZORAL) 2 % shampoo 1 application (not administered)  acetaminophen (TYLENOL) tablet 650 mg (not administered)  ondansetron (ZOFRAN) injection 4 mg (not administered)  gi cocktail (Maalox,Lidocaine,Donnatal) (not administered)  aspirin EC tablet 325 mg (not administered)  cefTRIAXone (ROCEPHIN) 1 g in dextrose 5 % 50 mL IVPB (not administered)  furosemide (LASIX) injection 20 mg (not administered)  cefTRIAXone (ROCEPHIN) 1 g in dextrose 5 % 50 mL IVPB (1 g Intravenous New Bag/Given 01/02/17 2037)  iopamidol (ISOVUE-370) 76 % injection (100 mLs  Contrast Given 01/02/17 2243)     Initial Impression / Assessment and Plan / ED Course  I have reviewed the triage vital signs and the nursing notes.  Pertinent labs & imaging results that were available during my care of the patient were reviewed by me and considered in my medical decision making (see chart for details).     76 year old female history of Alzheimer disease, schizophrenia, and hypothyroidism who presents from ALF with caretaker (sister)  for chest pain.  Patient developed onset of substernal chest pain while at facility.  She did not appear to want  to move due to discomfort.  No history of DVT or PE.  Sister notes that patient was recently taken off Lasix over the past several days due to reported hypotension.  Since then, sister notes large amount of BLE swelling despite elevation and compression stockings.  Sister also notes patient completed course of unclear antibiotics for possible pneumonia.  No reported fevers.  Afebrile, VSS.  Patient with new oxygen requirement bedside of 2 L, desats to low 80s% on room air.  Lungs clear to auscultation bilaterally.  Abdomen soft benign throughout.  2+ BLE edema.  CXR showing streaky and hazy right lung base opacity, most likely atelectasis.  EKG showing sinus rhythm and probable left atrial enlargement.  Catheterized UA with evidence of UTI including positive nitrites, small leukocytes, few bacteria, and 6-30 urine WBCs.  Blood and urine cultures sent.  Patient started on Rocephin.  Discussed Lasix with inpatient team.  However, patient with BP of 90s/60s.  Will hold Lasix for concern of hypotension.  CTA PE study ordered for further evaluation of new oxygen requirement.  Patient admitted to the hospitalist service for further management and evaluation.  Patient stable at time of transfer.  Pt care d/w Dr. Jeneen Rinks  Final Clinical Impressions(s) / ED Diagnoses   Final diagnoses:  Obsessive-compulsive disorder, unspecified type    New Prescriptions Current Discharge Medication List       Payton Emerald, MD 01/03/17 0405    Tanna Furry, MD 01/15/17 1012

## 2017-01-02 NOTE — ED Notes (Signed)
Patient transported to X-ray 

## 2017-01-02 NOTE — ED Notes (Signed)
Family at bedside. 

## 2017-01-02 NOTE — H&P (Signed)
History and Physical    Crystal Braun GEX:[id number] DOB: Oct 21, 1940 DOA: 01/02/2017  PCP: Orvis Brill, [facility]  Patient coming from:  SNF  Chief Complaint:  weakness  HPI: Crystal Braun is a 76 y.o. female with medical history significant of dementia, HLD, OCD, schizophrenia, recent pna been on levaquin, recent discontinuation of lasix due to hypotension at SNF comes in with more weak than normal.  History is very limited from pt due to her dementia.   However her sister who is her NOK is at bedside and reports last week her lasix was stopped due to soft bp.  She was on lasix 20mg  bid.  Unknown if she has h/o CHF.  She was also diagnosed with pna over a week ago and put on levaquin.  She has had no cough.  No fevers.  However her legs have gotten very swollen bilaterally.  Today pt was walking and complained of some chest pain, details are not known due to pt dementia so was therefore sent to ED.  She was found to be hypoxic in the mid 80s and has no h/o chronic supplemental oxygen requirement at SNF.  She does get around normally with a walker with assistance.  She denies anything but again answers are somewhat unreliable.  She still recognizes her sister on a daily basis.  Pt found to have a UTI and hypoxia and referred for admission for that along with chest pain and likely chf exacerbation.    Review of Systems: unable to obtain due to dementia  Past Medical History:  Diagnosis Date  . Alzheimer disease   . Anemia, unspecified   . Broken femur ([place]) 2009   2 broken bones below knee, ([place])  . Diverticulosis of colon (without mention of hemorrhage)   . Esophageal reflux   . Hyperlipidemia   . Incontinence   . Major depressive disorder, recurrent episode, moderate ([place])   . Obsessive-compulsive disorders   . Osteoporosis   . Personal history of colonic polyps   . Pneumonia   . Special screening for malignant neoplasms, colon   . Thyroid disease    hypothroidism  .  Unspecified schizophrenia, unspecified condition   . Weight gain     Past Surgical History:  Procedure Laterality Date  . cervix excision     [place] electrical surgical excision of the cervix  . EYE SURGERY  12/2010   cataract  . HEMIARTHROPLASTY HIP     Left [place]  . JOINT REPLACEMENT  2007 or 2008   left hip  . ORIF FEMUR FRACTURE- LISS PLATE       reports that she quit smoking about 20 years ago. She does not have any smokeless tobacco history on file. She reports that she does not drink alcohol or use drugs.  Allergies  Allergen Reactions  . Codeine     Patient unsure of reaction as of appointment.  . Latex     rash    Family History  Problem Relation Age of Onset  . Stroke Mother   . Breast cancer Mother   . Heart failure Mother   . Liver cancer Father     Prior to Admission medications   Medication Sig Start Date End Date Taking? Authorizing Provider  acetaminophen (TYLENOL) 500 MG tablet Take 500 mg by mouth every 8 (eight) hours.     Yes [provider]  Calcium Carb-Cholecalciferol (CALCIUM-VITAMIN D) 600-400 MG-UNIT TABS Take 1 tablet by mouth daily.   Yes [provider]  Cholecalciferol (VITAMIN D PO) Take 2,000 Units by mouth daily.    Yes [provider]  Cranberry 250 MG CAPS Take by mouth daily.     Yes [provider]  divalproex (DEPAKOTE) 250 MG DR tablet Take 500 mg by mouth 2 (two) times daily.   Yes [provider]  ketoconazole (NIZORAL) 2 % shampoo Apply 1 application topically daily.   Yes [provider]  loperamide (IMODIUM) 2 MG capsule Take 2 mg by mouth 3 (three) times daily as needed for diarrhea or loose stools.   Yes [provider]  LORazepam (ATIVAN) 0.5 MG tablet Take 0.5 mg by mouth 3 (three) times daily as needed for anxiety.   Yes [provider]  Melatonin 3 MG TABS Take 3 mg by mouth at bedtime.   Yes [provider]  memantine (NAMENDA) 10 MG  tablet Take 10 mg by mouth daily.     Yes [provider]  Menthol-Zinc Oxide (CALMOSEPTINE) 0.44-20.625 % OINT Apply topically as needed.     Yes [provider]  metroNIDAZOLE (METROGEL) 0.75 % gel Apply 1 application topically daily.   Yes [provider]  sertraline (ZOLOFT) 50 MG tablet Take 50 mg by mouth daily.     Yes [provider]  zinc oxide 20 % ointment Apply 1 application topically as needed for irritation.   Yes [provider]  alendronate (FOSAMAX) 35 MG tablet Take 35 mg by mouth every 7 (seven) days. Take with a full glass of water on an empty stomach.     [provider]  aspirin 81 MG tablet Take 81 mg by mouth daily.      [provider]  Docusate Calcium (STOOL SOFTENER PO) Take by mouth as needed.      [provider]  hydrocortisone 1 % cream Apply topically 2 (two) times daily.      [provider]  pantoprazole (PROTONIX) 40 MG tablet Take 1 tablet (40 mg total) by mouth 2 (two) times daily. Patient not taking: Reported on 01/02/2017 11/28/10   Lafayette Dragon, MD  QUEtiapine (SEROQUEL) 50 MG tablet Take 50 mg by mouth at bedtime.      [provider]    Physical Exam: Vitals:   01/02/17 1930 01/02/17 1945 01/02/17 2000 01/02/17 2045  BP: 101/69 94/60 96/64  103/84  Pulse: 78 78 82 79  Resp: 20 (!) 21 (!) 21 (!) 24  Temp:      TempSrc:      SpO2: 95% 96% 96% 100%    Constitutional: NAD, calm, comfortable, pleasantly demented Vitals:   01/02/17 1930 01/02/17 1945 01/02/17 2000 01/02/17 2045  BP: 101/69 94/60 96/64  103/84  Pulse: 78 78 82 79  Resp: 20 (!) 21 (!) 21 (!) 24  Temp:      TempSrc:      SpO2: 95% 96% 96% 100%   Eyes: PERRL, lids and conjunctivae normal ENMT: Mucous membranes are moist. Posterior pharynx clear of any exudate or lesions.Normal dentition.  Neck: normal, supple, no masses, no thyromegaly Respiratory: clear to auscultation bilaterally, no  wheezing, no crackles. Normal respiratory effort. No accessory muscle use.  Cardiovascular: Regular rate and rhythm, no murmurs / rubs / gallops. No extremity edema. 2+ pedal pulses. No carotid bruits.  Abdomen: no tenderness, no masses palpated. No hepatosplenomegaly. Bowel sounds positive.  Musculoskeletal: no clubbing / cyanosis. No joint deformity upper and lower extremities. Good ROM, no contractures. Normal muscle tone.  Skin: no rashes, lesions, ulcers. No induration Neurologic: CN 2-12 grossly intact. Sensation intact, DTR normal. Strength 5/5 in all 4.  Psychiatric: oriented to name only.  Not agitated.  Labs on Admission: I have personally reviewed following labs and imaging studies  CBC:  Recent Labs Lab 01/02/17 1750  WBC 4.8  HGB 10.7*  HCT 34.8*  MCV 91.6  PLT 485   Basic Metabolic Panel:  Recent Labs Lab 01/02/17 1750  NA 138  K 4.0  CL 98*  CO2 31  GLUCOSE 97  BUN 10  CREATININE 1.00  CALCIUM 8.5*   GFR: CrCl cannot be calculated (Unknown ideal weight.).  Urine analysis:    Component Value Date/Time   COLORURINE YELLOW 01/02/2017 1858   APPEARANCEUR HAZY (A) 01/02/2017 1858   LABSPEC 1.025 01/02/2017 1858   PHURINE 6.0 01/02/2017 1858   GLUCOSEU NEGATIVE 01/02/2017 1858   HGBUR TRACE (A) 01/02/2017 1858   BILIRUBINUR NEGATIVE 01/02/2017 1858   KETONESUR NEGATIVE 01/02/2017 1858   PROTEINUR NEGATIVE 01/02/2017 1858   UROBILINOGEN 0.2 11/16/2009 1212   NITRITE POSITIVE (A) 01/02/2017 1858   LEUKOCYTESUR SMALL (A) 01/02/2017 1858   Radiological Exams on Admission: Dg Chest 2 View  Result Date: 01/02/2017 CLINICAL DATA:  Pt BIB [facility] for CP. Per EMS pt was walking to dinner at 1630 and started c/o CP; resolved with 324 ASA PTA. Pt oriented to person and place; Resp e/u; NAD noted at this time. Hx of pneumonia. EXAM: CHEST  2 VIEW COMPARISON:  03/26/2011 FINDINGS: There is significant elevation of left hemidiaphragm, stable from  the prior study. Streaky and hazy opacity at the right lung base is noted, new from the prior exam. This may reflect atelectasis or infection, the former favored. The remainder of the lungs is clear. No convincing pleural effusion. No pneumothorax. Cardiac silhouette is mildly enlarged. No convincing mediastinal or hilar masses. Skeletal structures are demineralized, grossly intact. IMPRESSION: 1. Streaky and hazy right lung base opacity, most likely atelectasis. Pneumonia should be considered if there are consistent clinical symptoms. 2. No other evidence of acute cardiopulmonary disease. No pulmonary edema. Electronically Signed   By: Lajean Manes M.D.   On: 01/02/2017 18:31    EKG: Independently reviewed. nsr no acute issues cxr reviewed no edema or infiltrate Old chart reviewed Case discussed with EDP  Assessment/Plan 76 yo female demented comes in with chest pain and acute hypoxic respiratory failure found to have UTI  Principal Problem:   Acute respiratory failure with hypoxia ([place])- unclear etiology.  Unknown if pt has h/o chf, cxr shows no edema.  Will obtain CTA to r/o PE as she is certainly high risk.  oxgyen sats are normal on 2 liters.  Assess for chf as below.    Active Problems:   Schizophrenia ([place])- stable   Alzheimer's disease- stable   Obsessive-compulsive disorder- stable   Acute lower UTI-  Obtain urine cx and place on iv rocephin   Weakness generalized- multifactorial   Chest pain -  Initial trop neg and ekg nonischemic.  R/o PE.  Obtain cardiac echo in am.   Denies pain at this time. Daily aspirin.     Have discussed code status with sister who in [facility].  Wishes no cpr or intubation ever in the future.  Agreeable to other treatments such as ivf, iv abx, oral meds and any other iv meds if needed at this time.  Change status to DNR.  DVT prophylaxis:  scds Code Status:  DNR Family Communication:sister Disposition Plan:  Per day team  Consults called:  none Admission  status:  Inpatient due to hypoxia   Karleigh Bunte A MD [facility]  If 7PM-7AM, please contact night-coverage [email or web address] Password [facility]  01/02/2017, 9:19 PM

## 2017-01-02 NOTE — ED Triage Notes (Signed)
Pt BIB EMS from [place] for CP. Per EMS pt was walking to dinner at 1630 and started c/o CP; resolved with 324 ASA PTA. Pt oriented to person and place; Resp e/u; NAD noted at this time.

## 2017-01-02 NOTE — ED Notes (Signed)
ED Provider at bedside. 

## 2017-01-03 ENCOUNTER — Encounter (HOSPITAL_COMMUNITY): Payer: Self-pay | Admitting: *Deleted

## 2017-01-03 DIAGNOSIS — J9601 Acute respiratory failure with hypoxia: Principal | ICD-10-CM

## 2017-01-03 LAB — TROPONIN I

## 2017-01-03 LAB — GLUCOSE, CAPILLARY: GLUCOSE-CAPILLARY: 97 mg/dL (ref 65–99)

## 2017-01-03 LAB — MRSA PCR SCREENING: MRSA BY PCR: POSITIVE — AB

## 2017-01-03 MED ORDER — CHLORHEXIDINE GLUCONATE CLOTH 2 % EX PADS
6.0000 | MEDICATED_PAD | Freq: Every day | CUTANEOUS | Status: DC
  Administered 2017-01-04: 6 via TOPICAL

## 2017-01-03 MED ORDER — MUPIROCIN 2 % EX OINT
1.0000 "application " | TOPICAL_OINTMENT | Freq: Two times a day (BID) | CUTANEOUS | Status: DC
  Administered 2017-01-03 – 2017-01-04 (×2): 1 via NASAL
  Filled 2017-01-03: qty 22

## 2017-01-03 NOTE — Plan of Care (Signed)
Problem: Education: Goal: Knowledge of [facility] Education information/materials will improve Outcome: Completed/Met Date Met: 01/03/17 Admission booklet reviewed with sister, Leda Gauze   Problem: Health Behavior/Discharge Planning: Goal: Ability to manage health-related needs will improve Outcome: Progressing Patient resides at [facility]; sister also assists in patients care   Problem: Pain Managment: Goal: General experience of comfort will improve Outcome: Progressing Denies chest pain atthis time   Problem: Physical Regulation: Goal: Ability to maintain clinical measurements within normal limits will improve Outcome: Progressing Maintaining oxygen saturation on room air in the 90's   Problem: Fluid Volume: Goal: Ability to maintain a balanced intake and output will improve Outcome: Progressing diuresing

## 2017-01-03 NOTE — Progress Notes (Signed)
PROGRESS NOTE    Crystal Braun  YWV:[id number] DOB: 01-22-1941 DOA: 01/02/2017 PCP: Orvis Brill[facility]    Brief Narrative:  76 y.o. female with medical history significant of dementia, HLD, OCD, schizophrenia, recent pna been on levaquin, who presents to the hospital secondary to weakness. Patient reportedly was on Lasix but her sister does not have history of CHF. Per my discussion with sister it is believed patient is getting Lasix secondary to lower extremity edema of etiology unknown per my discussion with sister and patient.  Assessment & Plan:   Principal Problem:   Acute respiratory failure with hypoxia ([place]) - Etiology uncertain at this point. Agree with continuing antibiotic. Could be atelectasis secondary to severe active infiltration of left hemi-diaphragm as reported on CT scan. No PE on CT scan. - We'll order incentive spirometer - For now, for infection although I have low index of suspicion for this as such will not cover atypicals with azithromycin or similar agent. Patient has been afebrile  Active Problems:   Schizophrenia ([place]) - Stable    Alzheimer's disease - Continue Namenda    Obsessive-compulsive disorder   Acute lower UTI - Patient is currently on Rocephin. Follow-up with urine culture which is currently pending    Weakness generalized - I believe this is a chronic condition secondary to patient being sedentary  DVT prophylaxis: TED hose Code Status: DNR Family Communication: Discussed with sister Disposition Plan: Pending improvement in respiratory condition patient will need physical therapy evaluation   Consultants:   None   Procedures: None   Antimicrobials: Rocephin   Subjective: Pt has limited responses to my questions. Most of my questions answered by sister  Objective: Vitals:   01/03/17 0606 01/03/17 0947 01/03/17 1019 01/03/17 1324  BP: 103/63 112/60  108/67  Pulse: 81 73  80  Resp: 20   18  Temp: 97.4 F (36.3  C)   98 F (36.7 C)  TempSrc: Oral   Oral  SpO2: 94% 97% 92% 94%  Weight: 78.5 kg (173 lb)     Height:        Intake/Output Summary (Last 24 hours) at 01/03/17 1706 Last data filed at 01/03/17 1000  Gross per 24 hour  Intake              360 ml  Output                0 ml  Net              360 ml   Filed Weights   01/02/17 2140 01/03/17 0606  Weight: 80.2 kg (176 lb 11.2 oz) 78.5 kg (173 lb)    Examination:  General exam: Patient does not answer my questions directly. Gazes in my general direction. Patient in no acute distress Respiratory system: No wheezes, equal chest rise, poor inspiratory effort Cardiovascular system: S1 & S2 heard, RRR. No JVD, murmurs, rubs. Gastrointestinal system: Abdomen is nondistended, soft and nontender. No organomegaly or masses felt. Normal bowel sounds heard. No guarding Central nervous system: Alert and awake. No facial asymmetry. Difficult exam secondary to dementia and schizophrenia and limited patient cooperation. Extremities: Symmetric 5 x 5 power. Skin: No rashes, lesions or ulcers, on limited exam Psychiatry: Difficult exam secondary to dementia and schizophrenia and limited patient cooperation. As such unable to assess well    Data Reviewed: I have personally reviewed following labs and imaging studies  CBC:  Recent Labs Lab 01/02/17 1750  WBC 4.8  HGB 10.7*  HCT 34.8*  MCV 91.6  PLT 737   Basic Metabolic Panel:  Recent Labs Lab 01/02/17 1750  NA 138  K 4.0  CL 98*  CO2 31  GLUCOSE 97  BUN 10  CREATININE 1.00  CALCIUM 8.5*   GFR: Estimated Creatinine Clearance: 51 mL/min (by C-G formula based on SCr of 1 mg/dL). Liver Function Tests: No results for input(s): AST, ALT, ALKPHOS, BILITOT, PROT, ALBUMIN in the last 168 hours. No results for input(s): LIPASE, AMYLASE in the last 168 hours. No results for input(s): AMMONIA in the last 168 hours. Coagulation Profile: No results for input(s): INR, PROTIME in the last  168 hours. Cardiac Enzymes:  Recent Labs Lab 01/02/17 2237 01/03/17 0021 01/03/17 0415  TROPONINI <0.03 <0.03 <0.03   BNP (last 3 results) No results for input(s): PROBNP in the last 8760 hours. HbA1C: No results for input(s): HGBA1C in the last 72 hours. CBG:  Recent Labs Lab 01/03/17 0747  GLUCAP 97   Lipid Profile: No results for input(s): CHOL, HDL, LDLCALC, TRIG, CHOLHDL, LDLDIRECT in the last 72 hours. Thyroid Function Tests: No results for input(s): TSH, T4TOTAL, FREET4, T3FREE, THYROIDAB in the last 72 hours. Anemia Panel: No results for input(s): VITAMINB12, FOLATE, FERRITIN, TIBC, IRON, RETICCTPCT in the last 72 hours. Sepsis Labs: No results for input(s): PROCALCITON, LATICACIDVEN in the last 168 hours.  Recent Results (from the past 240 hour(s))  Blood culture (routine x 2)     Status: None (Preliminary result)   Collection Time: 01/02/17  8:21 PM  Result Value Ref Range Status   Specimen Description BLOOD LEFT ANTECUBITAL  Final   Special Requests   Final    BOTTLES DRAWN AEROBIC AND ANAEROBIC Blood Culture adequate volume   Culture NO GROWTH < 24 HOURS  Final   Report Status PENDING  Incomplete  Blood culture (routine x 2)     Status: None (Preliminary result)   Collection Time: 01/02/17  8:35 PM  Result Value Ref Range Status   Specimen Description BLOOD RIGHT ANTECUBITAL  Final   Special Requests   Final    BOTTLES DRAWN AEROBIC AND ANAEROBIC Blood Culture adequate volume   Culture NO GROWTH < 24 HOURS  Final   Report Status PENDING  Incomplete  MRSA PCR Screening     Status: Abnormal   Collection Time: 01/03/17  2:21 PM  Result Value Ref Range Status   MRSA by PCR POSITIVE (A) NEGATIVE Final    Comment:        The GeneXpert MRSA Assay (FDA approved for NASAL specimens only), is one component of a comprehensive MRSA colonization surveillance program. It is not intended to diagnose MRSA infection nor to guide or monitor treatment for MRSA  infections. RESULT CALLED TO, READ BACK BY AND VERIFIED WITH: H. BULLINS [id number] 06.23.2018 N. MORRIS          Radiology Studies: Dg Chest 2 View  Result Date: 01/02/2017 CLINICAL DATA:  Pt BIB EMS from [place] [facility] for CP. Per EMS pt was walking to dinner at 1630 and started c/o CP; resolved with 324 ASA PTA. Pt oriented to person and place; Resp e/u; NAD noted at this time. Hx of pneumonia. EXAM: CHEST  2 VIEW COMPARISON:  03/26/2011 FINDINGS: There is significant elevation of left hemidiaphragm, stable from the prior study. Streaky and hazy opacity at the right lung base is noted, new from the prior exam. This may reflect atelectasis or infection, the former favored. The remainder of the lungs is  clear. No convincing pleural effusion. No pneumothorax. Cardiac silhouette is mildly enlarged. No convincing mediastinal or hilar masses. Skeletal structures are demineralized, grossly intact. IMPRESSION: 1. Streaky and hazy right lung base opacity, most likely atelectasis. Pneumonia should be considered if there are consistent clinical symptoms. 2. No other evidence of acute cardiopulmonary disease. No pulmonary edema. Electronically Signed   By: Lajean Manes M.D.   On: 01/02/2017 18:31   Ct Angio Chest Pe W And/or Wo Contrast  Result Date: 01/02/2017 CLINICAL DATA:  76 year old female with shortness of breath. EXAM: CT ANGIOGRAPHY CHEST WITH CONTRAST TECHNIQUE: Multidetector CT imaging of the chest was performed using the standard protocol during bolus administration of intravenous contrast. Multiplanar CT image reconstructions and MIPs were obtained to evaluate the vascular anatomy. CONTRAST:  100 cc Isovue 370 COMPARISON:  Chest radiograph dated 01/02/2017 FINDINGS: Evaluation of the study is limited due to streak artifact caused by patient's arms. Cardiovascular: There is no cardiomegaly or pericardial effusion. The thoracic aorta is unremarkable. Evaluation of the pulmonary arteries is limited due  to streak artifact caused by patient's arms and suboptimal opacification of the peripheral branches. No central pulmonary artery embolus identified. Mediastinum/Nodes: There is no hilar or mediastinal adenopathy. Esophagus is grossly unremarkable. No mediastinal fluid collection or hematoma. 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 There is consolidative changes of the left lung base, which may represent compressive atelectasis or infiltrate. Clinical correlation is recommended. Right lung base atelectatic changes/scarring noted. There is a small left pleural effusion. No pneumothorax. The central airways are patent. There is compression of the left lower lobe bronchus by the mass effect caused by elevated left hemidiaphragm and abdominal content. Upper Abdomen: There is colonic diverticulosis. The visualized upper abdomen is otherwise unremarkable. Musculoskeletal: There is degenerative changes of the spine. No acute fracture. Review of the MIP images confirms the above findings. IMPRESSION: 1. No CT evidence of central pulmonary artery embolus. 2. 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 There is consolidative changes of the left lung base which may represent postobstructive or compressive atelectasis. Infiltrate is not excluded. Clinical correlation is recommended. 3. Small left pleural effusion. Electronically Signed   By: Anner Crete M.D.   On: 01/02/2017 23:17    Scheduled Meds: . acetaminophen  500 mg Oral Q8H  . aspirin EC  325 mg Oral Daily  . calcium-vitamin D  1 tablet Oral Daily  . [START ON 01/04/2017] Chlorhexidine Gluconate Cloth  6 each Topical Q0600  . divalproex  500 mg Oral BID  . furosemide  20 mg Intravenous Once  . furosemide  20 mg Intravenous Daily  . ketoconazole  1 application Topical Daily  . Melatonin  3 mg Oral QHS  . memantine  10 mg Oral Daily  . mupirocin ointment  1  application Nasal BID  . sertraline  50 mg Oral Daily   Continuous Infusions: . cefTRIAXone (ROCEPHIN)  IV       LOS: 1 day   Time spent: > 35 minutes Velvet Bathe, MD [facility] Pager [phone]  If 7PM-7AM, please contact night-coverage [email or web address] Password [facility] 01/03/2017, 5:06 PM

## 2017-01-03 NOTE — Progress Notes (Signed)
Patient stable during 7 a to 7 p shift, very adamant during entire shift that she was going back to [facility] today.  Patient with a couple of large incontinent episodes, purewick applied with some success.  Patient did pull out IV at 6:30 and demand RN call the ambulance to come and get her to take her back to [facility].  IV replaced.  Ativan given earlier in the shift with some improvement. Sister Leda Gauze did come in to visit with patient around lunch time.

## 2017-01-04 ENCOUNTER — Inpatient Hospital Stay (HOSPITAL_COMMUNITY): Payer: Medicare Other

## 2017-01-04 DIAGNOSIS — R9431 Abnormal electrocardiogram [ECG] [EKG]: Secondary | ICD-10-CM

## 2017-01-04 LAB — CBC
HCT: 36.1 % (ref 36.0–46.0)
Hemoglobin: 11.3 g/dL — ABNORMAL LOW (ref 12.0–15.0)
MCH: 29.2 pg (ref 26.0–34.0)
MCHC: 31.3 g/dL (ref 30.0–36.0)
MCV: 93.3 fL (ref 78.0–100.0)
PLATELETS: 163 10*3/uL (ref 150–400)
RBC: 3.87 MIL/uL (ref 3.87–5.11)
RDW: 16.4 % — AB (ref 11.5–15.5)
WBC: 4.1 10*3/uL (ref 4.0–10.5)

## 2017-01-04 LAB — ECHOCARDIOGRAM COMPLETE
Height: 68 in
WEIGHTICAEL: 2768 [oz_av]

## 2017-01-04 LAB — BASIC METABOLIC PANEL
Anion gap: 8 (ref 5–15)
BUN: 13 mg/dL (ref 6–20)
CALCIUM: 8.4 mg/dL — AB (ref 8.9–10.3)
CHLORIDE: 100 mmol/L — AB (ref 101–111)
CO2: 31 mmol/L (ref 22–32)
CREATININE: 0.98 mg/dL (ref 0.44–1.00)
GFR calc non Af Amer: 54 mL/min — ABNORMAL LOW (ref >60)
GLUCOSE: 102 mg/dL — AB (ref 65–99)
Potassium: 4.1 mmol/L (ref 3.5–5.1)
Sodium: 139 mmol/L (ref 135–145)

## 2017-01-04 LAB — GLUCOSE, CAPILLARY
GLUCOSE-CAPILLARY: 94 mg/dL (ref 65–99)
Glucose-Capillary: 109 mg/dL — ABNORMAL HIGH (ref 65–99)

## 2017-01-04 MED ORDER — LEVOFLOXACIN 500 MG PO TABS: 500.0000 mg | ORAL_TABLET | Freq: Every day | ORAL | 0 refills | Status: AC

## 2017-01-04 NOTE — Evaluation (Signed)
Physical Therapy Evaluation Patient Details Name: Crystal Braun MRN: [id number] DOB: [birth year] Today's Date: 01/04/2017   History of Present Illness  Pt is a 76 yo female admitted through ED on 01/02/17 from [facility] with diagnosis of ARF with hypoxia, UTI and weakness. PMH significant for OCD, Schizophrenia, anemia, diverticulosis, HLD, depression.   Clinical Impression  Pt presents with the above diagnosis and below deficits. Prior to admission, pt lived at a memory care unit of [facility]. Pt required assistace for all ADLs and IADLs. Pt requires Mod A for all mobility this session and required +2 for safety with sit to stand and pivot transfer. Pt will benefit from continued acute rehab services in order to address the below deficits prior to discharge. With 24hr supervision, pt will be good to discharge back to ALF with therapy services.     Follow Up Recommendations Home health PT;Supervision/Assistance - 24 hour    Equipment Recommendations  None recommended by PT    Recommendations for Other Services       Precautions / Restrictions Precautions Precautions: Fall Restrictions Weight Bearing Restrictions: No      Mobility  Bed Mobility Overal bed mobility: Needs Assistance Bed Mobility: Supine to Sit     Supine to sit: Mod assist     General bed mobility comments: Mod A to sit upright at EOB and scoot hips forward  Transfers Overall transfer level: Needs assistance Equipment used: Rolling walker (2 wheeled) Transfers: Sit to/from [facility] Sit to Stand: Mod assist;+2 physical assistance Stand pivot transfers: Mod assist;+2 physical assistance       General transfer comment: Mod A x2 from EOB to recliner with cues for sequencing. pt requires cues to hold onto RW  Ambulation/Gait             General Gait Details: Unable to attempt this session  Stairs            Wheelchair Mobility    Modified Rankin (Stroke Patients  Only)       Balance Overall balance assessment: Needs assistance Sitting-balance support: Single extremity supported;Feet supported Sitting balance-Leahy Scale: Fair     Standing balance support: Bilateral upper extremity supported Standing balance-Leahy Scale: Poor                               Pertinent Vitals/Pain Pain Assessment: No/denies pain    Home Living Family/patient expects to be discharged to:: Assisted living               Home Equipment: Walker - 2 wheels;Bedside commode;Tub bench;Grab bars - toilet;Grab bars - tub/shower Additional Comments: living at [facility]    Prior Function Level of Independence: Needs assistance   Gait / Transfers Assistance Needed: short distance gait with RW  ADL's / Homemaking Assistance Needed: requires assistance from staff for all ADLs and IADLs        Hand Dominance   Dominant Hand: Right    Extremity/Trunk Assessment   Upper Extremity Assessment Upper Extremity Assessment: Generalized weakness    Lower Extremity Assessment Lower Extremity Assessment: Generalized weakness    Cervical / Trunk Assessment Cervical / Trunk Assessment: Normal  Communication   Communication: No difficulties  Cognition Arousal/Alertness: Awake/alert Behavior During Therapy: Anxious Overall Cognitive Status: No family/caregiver present to determine baseline cognitive functioning  General Comments: pt is OCD and repeats needs multiple times and requires redirection      General Comments      Exercises     Assessment/Plan    PT Assessment Patient needs continued PT services  PT Problem List Decreased strength;Decreased activity tolerance;Decreased balance;Decreased mobility;Decreased cognition       PT Treatment Interventions DME instruction;Gait training;Functional mobility training;Therapeutic activities;Therapeutic exercise;Balance training    PT Goals  (Current goals can be found in the Care Plan section)  Acute Rehab PT Goals Patient Stated Goal: to get back to [facility] PT Goal Formulation: Patient unable to participate in goal setting Time For Goal Achievement: 01/11/17 Potential to Achieve Goals: Fair    Frequency Min 3X/week   Barriers to discharge        Co-evaluation               AM-PAC PT "6 Clicks" Daily Activity  Outcome Measure Difficulty turning over in bed (including adjusting bedclothes, sheets and blankets)?: Total Difficulty moving from lying on back to sitting on the side of the bed? : Total Difficulty sitting down on and standing up from a chair with arms (e.g., wheelchair, bedside commode, etc,.)?: Total Help needed moving to and from a bed to chair (including a wheelchair)?: A Lot Help needed walking in hospital room?: A Lot Help needed climbing 3-5 steps with a railing? : Total 6 Click Score: 8    End of Session Equipment Utilized During Treatment: Gait belt Activity Tolerance: Patient tolerated treatment well Patient left: in chair;with call bell/phone within reach;with chair alarm set;with nursing/sitter in room Nurse Communication: Mobility status PT Visit Diagnosis: Unsteadiness on feet (R26.81);Muscle weakness (generalized) (M62.81)    Time: 6122-4497 PT Time Calculation (min) (ACUTE ONLY): 24 min   Charges:   PT Evaluation $PT Eval Moderate Complexity: 1 Procedure PT Treatments $Therapeutic Activity: 8-22 mins   PT G Codes:        Scheryl Marten PT, DPT  [phone]   Jacqulyn Liner Sloan Leiter 01/04/2017, 1:19 PM

## 2017-01-04 NOTE — Progress Notes (Signed)
RN realized after patient left with [facility] that her prescription for Levaquin was left on the unit.  Contacted Belinda at [facility] who gave the fax number [phone] and RN faxed over the prescription.  Did receive confirmation that fax was successful.

## 2017-01-04 NOTE — Clinical Social Work Placement (Signed)
   CLINICAL SOCIAL WORK PLACEMENT  NOTE  Date:  01/04/2017  Patient Details  Name: Crystal Braun MRN: [id number] Date of Birth: 1940-08-05  Clinical Social Work is seeking post-discharge placement for this patient at the [place] level of care (*CSW will initial, date and re-position this form in  chart as items are completed):  No   Patient/family provided with [facility] Work Department's list of facilities offering this level of care within the geographic area requested by the patient (or if unable, by the patient's family).  No   Patient/family informed of their freedom to choose among providers that offer the needed level of care, that participate in Medicare, Medicaid or managed care program needed by the patient, have an available bed and are willing to accept the patient.  No   Patient/family informed of [facility]'s ownership interest in [facility] and [facility], as well as of the fact that they are under no obligation to receive care at these facilities.  PASRR submitted to EDS on       PASRR number received on       Existing PASRR number confirmed on       FL2 transmitted to all facilities in geographic area requested by pt/family on       FL2 transmitted to all facilities within larger geographic area on       Patient informed that his/her managed care company has contracts with or will negotiate with certain facilities, including the following:            Patient/family informed of bed offers received.  Patient chooses bed at [facility]     Physician recommends and patient chooses bed at      Patient to be transferred to [facility] on 01/04/17.  Patient to be transferred to facility by [facility]     Patient family notified on 01/04/17 of transfer.  Name of family member notified:  [name]     PHYSICIAN Please sign DNR     Additional Comment:     _______________________________________________ Wende Neighbors, LCSW 01/04/2017, 4:20 PM

## 2017-01-04 NOTE — Progress Notes (Signed)
Report called to [facility] at [facility].  [facility] here to get patient.

## 2017-01-04 NOTE — Progress Notes (Signed)
Clinical Social Worker was consulted for a late discharge. Patient is from [facility] [place] (ALF). Per RN the patient wants to go back to ALF and would like [facility] for transport. CSW contacted facility to make them aware of patients discharge back to facility.  Rhea Pink, MSW,  [place]

## 2017-01-04 NOTE — Progress Notes (Signed)
  Echocardiogram 2D Echocardiogram has been performed.  Crystal Braun 01/04/2017, 1:50 PM

## 2017-01-04 NOTE — Discharge Summary (Signed)
Physician Discharge Summary  Crystal Braun BWL:[id number] DOB: 10/17/1940 DOA: 01/02/2017  PCP: Orvis Brill, [facility]  Admit date: 01/02/2017 Discharge date: 01/04/2017  Time spent: > 35 minutes  Recommendations for Outpatient Follow-up:  1. Patient had a grade 1 diastolic dysfunction I suspect she may have had a combination of mild DD flare with atelectasis secondary to elevated left diaphragm. Will not place on Lasix daily secondary to soft blood pressures. Please decide when to administer Lasix. Would weigh patient daily and if patient gains 3 pounds from 1 day to the next would consider administering Lasix dose.   Discharge Diagnoses:  Principal Problem:   Acute respiratory failure with hypoxia ([place]) Active Problems:   Schizophrenia ([place])   Alzheimer's disease   Obsessive-compulsive disorder   Acute lower UTI   Weakness generalized   Discharge Condition: stable  Diet recommendation:   Filed Weights   01/02/17 2140 01/03/17 0606  Weight: 80.2 kg (176 lb 11.2 oz) 78.5 kg (173 lb)    History of present illness:  76 y.o.femalewith medical history significant of dementia, HLD, OCD, schizophrenia, recent pna been on levaquin, who presents to the hospital secondary to weakness. Patient reportedly was on Lasix but her sister does not have history of CHF. Per my discussion with sister it is believed patient is getting Lasix secondary to lower extremity edema of etiology unknown per my discussion with sister and patient.  Hospital Course:  Principal Problem:   Acute respiratory failure with hypoxia ([place]) - Etiology uncertain at this point. I do not suspect patient has lung infection. Could be atelectasis secondary to severe active infiltration of left hemi-diaphragm as reported on CT scan. No PE on CT scan. - We'll order incentive spirometer - Patient's breathing condition improved with diuresis and after echocardiogram was performed was found to have grade 1 diastolic  dysfunction. I would not however place patient on daily Lasix as her blood pressures are soft. As such I have given instructions listed above.  Grade 1 diastolic dysfunction - Please see discussion listed above - Recommend salt restriction with 2 g assaulted less per day. And daily weights.  Active Problems:   Schizophrenia ([place]) - Stable    Alzheimer's disease - Continue Namenda    Obsessive-compulsive disorder   Acute lower UTI - Patient did not complain of any urinary tract infection symptoms to me. Has had 2 days of antibiotic therapy with Rocephin. We'll complete the third day with Levaquin    Weakness generalized - I believe this is a chronic condition secondary to patient being sedentary - Would recommend physical therapy packet facility   Procedures:  None  Consultations:  None  Discharge Exam: Vitals:   01/04/17 0624 01/04/17 1229  BP: 113/73 107/64  Pulse: 65 70  Resp: 18 18  Temp: 97.5 F (36.4 C) 97.8 F (36.6 C)    General: Pt in nad, alert and awake Cardiovascular: rrr, no rubs Respiratory: no increased wob, no wheezes  Discharge Instructions   Discharge Instructions    Call MD for:  difficulty breathing, headache or visual disturbances    Complete by:  As directed    Call MD for:  severe uncontrolled pain    Complete by:  As directed    Call MD for:  temperature >100.4    Complete by:  As directed    Diet - low sodium heart healthy    Complete by:  As directed    Increase activity slowly    Complete by:  As directed  Current Discharge Medication List    START taking these medications   Details  levofloxacin (LEVAQUIN) 500 MG tablet Take 1 tablet (500 mg total) by mouth daily. Qty: 1 tablet, Refills: 0      CONTINUE these medications which have NOT CHANGED   Details  acetaminophen (TYLENOL) 500 MG tablet Take 500 mg by mouth every 8 (eight) hours.      Calcium Carb-Cholecalciferol (CALCIUM-VITAMIN D) 600-400 MG-UNIT TABS  Take 1 tablet by mouth daily.    Cholecalciferol (VITAMIN D PO) Take 2,000 Units by mouth daily.     Cranberry 250 MG CAPS Take by mouth daily.      divalproex (DEPAKOTE) 250 MG DR tablet Take 500 mg by mouth 2 (two) times daily.    ketoconazole (NIZORAL) 2 % shampoo Apply 1 application topically daily.    loperamide (IMODIUM) 2 MG capsule Take 2 mg by mouth 3 (three) times daily as needed for diarrhea or loose stools.    LORazepam (ATIVAN) 0.5 MG tablet Take 0.5 mg by mouth 3 (three) times daily as needed for anxiety.    Melatonin 3 MG TABS Take 3 mg by mouth at bedtime.    memantine (NAMENDA) 10 MG tablet Take 10 mg by mouth daily.      Menthol-Zinc Oxide (CALMOSEPTINE) 0.44-20.625 % OINT Apply topically as needed.      metroNIDAZOLE (METROGEL) 0.75 % gel Apply 1 application topically daily.    sertraline (ZOLOFT) 50 MG tablet Take 50 mg by mouth daily.      zinc oxide 20 % ointment Apply 1 application topically as needed for irritation.    alendronate (FOSAMAX) 35 MG tablet Take 35 mg by mouth every 7 (seven) days. Take with a full glass of water on an empty stomach.     aspirin 81 MG tablet Take 81 mg by mouth daily.      Docusate Calcium (STOOL SOFTENER PO) Take by mouth as needed.      hydrocortisone 1 % cream Apply topically 2 (two) times daily.      pantoprazole (PROTONIX) 40 MG tablet Take 1 tablet (40 mg total) by mouth 2 (two) times daily. Qty: 60 tablet, Refills: 4    QUEtiapine (SEROQUEL) 50 MG tablet Take 50 mg by mouth at bedtime.         Allergies  Allergen Reactions  . Codeine     Patient unsure of reaction as of appointment.  . Latex     rash      The results of significant diagnostics from this hospitalization (including imaging, microbiology, ancillary and laboratory) are listed below for reference.    Significant Diagnostic Studies: Dg Chest 2 View  Result Date: 01/02/2017 CLINICAL DATA:  Pt BIB EMS from [facility] for CP. Per EMS pt  was walking to dinner at 1630 and started c/o CP; resolved with 324 ASA PTA. Pt oriented to person and place; Resp e/u; NAD noted at this time. Hx of pneumonia. EXAM: CHEST  2 VIEW COMPARISON:  03/26/2011 FINDINGS: There is significant elevation of left hemidiaphragm, stable from the prior study. Streaky and hazy opacity at the right lung base is noted, new from the prior exam. This may reflect atelectasis or infection, the former favored. The remainder of the lungs is clear. No convincing pleural effusion. No pneumothorax. Cardiac silhouette is mildly enlarged. No convincing mediastinal or hilar masses. Skeletal structures are demineralized, grossly intact. IMPRESSION: 1. Streaky and hazy right lung base opacity, most likely atelectasis. Pneumonia should be considered  if there are consistent clinical symptoms. 2. No other evidence of acute cardiopulmonary disease. No pulmonary edema. Electronically Signed   By: Lajean Manes M.D.   On: 01/02/2017 18:31   Ct Angio Chest Pe W And/or Wo Contrast  Result Date: 01/02/2017 CLINICAL DATA:  76 year old female with shortness of breath. EXAM: CT ANGIOGRAPHY CHEST WITH CONTRAST TECHNIQUE: Multidetector CT imaging of the chest was performed using the standard protocol during bolus administration of intravenous contrast. Multiplanar CT image reconstructions and MIPs were obtained to evaluate the vascular anatomy. CONTRAST:  100 cc Isovue 370 COMPARISON:  Chest radiograph dated 01/02/2017 FINDINGS: Evaluation of the study is limited due to streak artifact caused by patient's arms. Cardiovascular: There is no cardiomegaly or pericardial effusion. The thoracic aorta is unremarkable. Evaluation of the pulmonary arteries is limited due to streak artifact caused by patient's arms and suboptimal opacification of the peripheral branches. No central pulmonary artery embolus identified. Mediastinum/Nodes: There is no hilar or mediastinal adenopathy. Esophagus is grossly unremarkable.  No mediastinal fluid collection or hematoma. 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 There is consolidative changes of the left lung base, which may represent compressive atelectasis or infiltrate. Clinical correlation is recommended. Right lung base atelectatic changes/scarring noted. There is a small left pleural effusion. No pneumothorax. The central airways are patent. There is compression of the left lower lobe bronchus by the mass effect caused by elevated left hemidiaphragm and abdominal content. Upper Abdomen: There is colonic diverticulosis. The visualized upper abdomen is otherwise unremarkable. Musculoskeletal: There is degenerative changes of the spine. No acute fracture. Review of the MIP images confirms the above findings. IMPRESSION: 1. No CT evidence of central pulmonary artery embolus. 2. 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 There is consolidative changes of the left lung base which may represent postobstructive or compressive atelectasis. Infiltrate is not excluded. Clinical correlation is recommended. 3. Small left pleural effusion. Electronically Signed   By: Anner Crete M.D.   On: 01/02/2017 23:17    Microbiology: Recent Results (from the past 240 hour(s))  Blood culture (routine x 2)     Status: None (Preliminary result)   Collection Time: 01/02/17  8:21 PM  Result Value Ref Range Status   Specimen Description BLOOD LEFT ANTECUBITAL  Final   Special Requests   Final    BOTTLES DRAWN AEROBIC AND ANAEROBIC Blood Culture adequate volume   Culture NO GROWTH 2 DAYS  Final   Report Status PENDING  Incomplete  Blood culture (routine x 2)     Status: None (Preliminary result)   Collection Time: 01/02/17  8:35 PM  Result Value Ref Range Status   Specimen Description BLOOD RIGHT ANTECUBITAL  Final   Special Requests   Final    BOTTLES DRAWN AEROBIC AND ANAEROBIC Blood Culture  adequate volume   Culture NO GROWTH 2 DAYS  Final   Report Status PENDING  Incomplete  MRSA PCR Screening     Status: Abnormal   Collection Time: 01/03/17  2:21 PM  Result Value Ref Range Status   MRSA by PCR POSITIVE (A) NEGATIVE Final    Comment:        The GeneXpert MRSA Assay (FDA approved for NASAL specimens only), is one component of a comprehensive MRSA colonization surveillance program. It is not intended to diagnose MRSA infection nor to guide or monitor treatment for MRSA infections. RESULT CALLED TO, READ BACK BY AND VERIFIED WITH: H.  BULLINS 1614 06.23.2018 N. MORRIS      Labs: Basic Metabolic Panel:  Recent Labs Lab 01/02/17 1750 01/04/17 0355  NA 138 139  K 4.0 4.1  CL 98* 100*  CO2 31 31  GLUCOSE 97 102*  BUN 10 13  CREATININE 1.00 0.98  CALCIUM 8.5* 8.4*   Liver Function Tests: No results for input(s): AST, ALT, ALKPHOS, BILITOT, PROT, ALBUMIN in the last 168 hours. No results for input(s): LIPASE, AMYLASE in the last 168 hours. No results for input(s): AMMONIA in the last 168 hours. CBC:  Recent Labs Lab 01/02/17 1750 01/04/17 0355  WBC 4.8 4.1  HGB 10.7* 11.3*  HCT 34.8* 36.1  MCV 91.6 93.3  PLT 176 163   Cardiac Enzymes:  Recent Labs Lab 01/02/17 2237 01/03/17 0021 01/03/17 0415  TROPONINI <0.03 <0.03 <0.03   BNP: BNP (last 3 results)  Recent Labs  01/02/17 2021  BNP 155.3*    ProBNP (last 3 results) No results for input(s): PROBNP in the last 8760 hours.  CBG:  Recent Labs Lab 01/03/17 0747 01/04/17 0726 01/04/17 1222  GLUCAP 97 94 109*     Signed:  Velvet Bathe MD.  [facility] 01/04/2017, 4:12 PM

## 2017-01-04 NOTE — Progress Notes (Signed)
Clinical Social Worker facilitated patient discharge including contacting patient family and facility to confirm patient discharge plans.  Clinical information faxed to facility and family agreeable with plan.  CSW arranged ambulance transport via [facility] to [facility] [place] .  RN to call [phone] (and ask for Belinda) for  report prior to discharge.  Clinical Social Worker will sign off for now as social work intervention is no longer needed. Please consult [place] again if new need arises.  Rhea Pink, MSW, [place]

## 2017-01-04 NOTE — Plan of Care (Signed)
Problem: Pain Managment: Goal: General experience of comfort will improve Outcome: Completed/Met Date Met: 01/04/17 Has denied any chest pain since admission   Problem: Physical Regulation: Goal: Ability to maintain clinical measurements within normal limits will improve Outcome: Completed/Met Date Met: 01/04/17 Oxygen level in the 90's on room air x the last 36 hours

## 2017-01-04 NOTE — Progress Notes (Signed)
  Echocardiogram 2D Echocardiogram has been performed.  Crystal Braun 01/04/2017, 2:29 PM

## 2017-01-05 LAB — URINE CULTURE

## 2017-01-07 LAB — CULTURE, BLOOD (ROUTINE X 2)
CULTURE: NO GROWTH
Culture: NO GROWTH
Special Requests: ADEQUATE
Special Requests: ADEQUATE

## 2017-01-07 NOTE — Progress Notes (Signed)
Urine culture reviewed. I made several calls and on my third call to patient's home I spoke to patient's sister [name] and discussed abnormal urine culture. Recommended that patient obtain different kind of antibiotics for her urine culture based on her results. I asked for her primary care's phone number. Identified as Crystal Braun with phone number [phone]. Left my cell phone with secretary and left VM indicating of urine culture results and recommended antibiotic Bactrim.  Vernard Gram, [employer]

## 2017-02-23 ENCOUNTER — Telehealth: Payer: Self-pay | Admitting: *Deleted

## 2017-02-23 NOTE — Telephone Encounter (Signed)
NOTES SENT TO SCHEDULING.  °

## 2017-03-20 ENCOUNTER — Ambulatory Visit: Payer: Medicare Other | Admitting: Cardiology

## 2017-03-23 ENCOUNTER — Ambulatory Visit: Payer: Medicare Other | Admitting: Physician Assistant

## 2017-03-30 ENCOUNTER — Ambulatory Visit (INDEPENDENT_AMBULATORY_CARE_PROVIDER_SITE_OTHER): Payer: Medicare Other | Admitting: Physician Assistant

## 2017-03-30 ENCOUNTER — Encounter: Payer: Self-pay | Admitting: Physician Assistant

## 2017-03-30 VITALS — BP 100/60 | HR 73 | Ht 68.0 in | Wt 166.8 lb

## 2017-03-30 DIAGNOSIS — I4510 Unspecified right bundle-branch block: Secondary | ICD-10-CM | POA: Diagnosis not present

## 2017-03-30 DIAGNOSIS — G309 Alzheimer's disease, unspecified: Secondary | ICD-10-CM | POA: Diagnosis not present

## 2017-03-30 DIAGNOSIS — I5032 Chronic diastolic (congestive) heart failure: Secondary | ICD-10-CM

## 2017-03-30 DIAGNOSIS — F0280 Dementia in other diseases classified elsewhere without behavioral disturbance: Secondary | ICD-10-CM

## 2017-03-30 NOTE — Progress Notes (Addendum)
Cardiology Office Note    Date:  03/30/2017   ID:  Crystal Braun, DOB 1941-03-18, MRN [id number]  PCP:  Orvis Brill, [facility]  Cardiologist: Angelena Form  Chief Complaint  Patient presents with  . New Patient (Initial Visit)    CHF    History of Present Illness:    Crystal Braun is a 76 y.o. female who is being seen today for the evaluation of CHF at the request of  Jacklyn Shell, NP.  Patient saw Dr.McAlhany 2012 for preoperative clearance for rectal prolapse surgery. She has a history of hyperlipidemia, Alzheimer's dementia, schizophrenia, obsessive-compulsive disorder. At that time she had multiple left hip and left leg fractures and was confined to wheelchair. She had no family history of heart disease prior cardiac problems. She was cleared for surgery without any further workup.  Patient is referred today for evaluation of CHF. Notes are reviewed from Jacklyn Shell, NP and only talk about leg pain that the patient was having. X-rays revealed moderate osteoarthritis. No mention of CHF. She has dementia so is hard to evaluate. Labs 01/22/2017 all normal including TSH and hemoglobin A1c. EKG from 2017 normal sinus rhythm with intraventricular conduction delay.  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 2-D echo showed grade 1 DD. Blood pressures were soft show she was not put on daily Lasix but was given instructions to take if he had 3 pound weight gain overnight. 2-D echo was of poor quality study normal LVEF 55-60% with grade 1 DD everything else was poorly visualized.  Patient comes in today accompanied by her sister. She has no history of chest pain, CAD, HTN, HLD, DM or smoking history. Nursing home notes say she's taking lasix 20 mg daily.Her sister says she has a Actuary that helps keep her legs elevated. Her legs are wrapped and  edema is down. Weights have been stable between 166 and 163 pounds. Patient never complains. She denies any chest pain, palpitations, dyspnea, dyspnea on exertion, dizziness or presyncope.  Past Medical History:  Diagnosis Date  . Acute lower UTI 01/02/2017  . Acute respiratory failure with hypoxia ([place]) 01/02/2017  . Alzheimer disease   . Alzheimer's disease 05/06/2010   Qualifier: Diagnosis of  By: Nelson-Smith CMA (AAMA), Dottie    . Anemia, unspecified   . Broken femur ([place]) 2009   2 broken bones below knee, ([place])  . Complete rectal prolapse with no displacement of anal muscles 02/11/2011  . Diverticulosis of colon (without mention of hemorrhage)   . Esophageal reflux   . Hyperlipidemia   . HYPOTHYROIDISM 07/16/2010   Qualifier: Diagnosis of  By: Tamala Julian CMA, Claiborne Billings    . Incontinence   . Major depressive disorder, recurrent episode, moderate ([place])   . NONSPECIFIC ABN FINDING RAD & OTH EXAM GI TRACT 07/17/2010   Qualifier: Diagnosis of  By: Bobby Rumpf CMA ([place]), Patty    . Obsessive-compulsive disorder   . Obsessive-compulsive disorders   . Osteoporosis   . Personal history of colonic polyps   . Pneumonia   . Schizophrenia ([place]) 05/06/2010   Qualifier: Diagnosis of  By: Nelson-Smith CMA (AAMA), Dottie    . Special screening for malignant neoplasms, colon   . Thyroid disease    hypothroidism  . Unspecified schizophrenia, unspecified condition   . Weakness generalized 01/02/2017  . Weight gain     Past Surgical History:  Procedure Laterality Date  .  cervix excision     [place] electrical surgical excision of the cervix  . EYE SURGERY  12/2010   cataract  . HEMIARTHROPLASTY HIP     Left [place]  . JOINT REPLACEMENT  2007 or 2008   left hip  . ORIF FEMUR FRACTURE- LISS PLATE      Current Medications: Current Meds  Medication Sig  . acetaminophen (TYLENOL) 500 MG tablet Take 500 mg by mouth every 8 (eight) hours.    . Calcium Carb-Cholecalciferol (CALCIUM-VITAMIN D) 600-400  MG-UNIT TABS Take 1 tablet by mouth daily.  . Cholecalciferol (VITAMIN D PO) Take 2,000 Units by mouth daily.   . Cranberry 250 MG CAPS Take by mouth daily.    . divalproex (DEPAKOTE) 250 MG DR tablet Take 500 mg by mouth 2 (two) times daily.  Mariane Baumgarten Calcium (STOOL SOFTENER PO) Take by mouth as needed.    . furosemide (LASIX) 20 MG tablet Take 20 mg by mouth daily.  . hydrocortisone 1 % cream Apply topically 2 (two) times daily.    Marland Kitchen ketoconazole (NIZORAL) 2 % shampoo Apply 1 application topically daily.  Marland Kitchen loperamide (IMODIUM) 2 MG capsule Take 2 mg by mouth 3 (three) times daily as needed for diarrhea or loose stools.  Marland Kitchen LORazepam (ATIVAN) 0.5 MG tablet Take 0.5 mg by mouth 3 (three) times daily as needed for anxiety.  . Melatonin 3 MG TABS Take 3 mg by mouth at bedtime.  . memantine (NAMENDA) 10 MG tablet Take 10 mg by mouth daily.    . Menthol-Zinc Oxide (CALMOSEPTINE) 0.44-20.625 % OINT Apply topically as needed.    . metroNIDAZOLE (METROGEL) 0.75 % gel Apply 1 application topically daily.  . sertraline (ZOLOFT) 50 MG tablet Take 50 mg by mouth daily.    Marland Kitchen zinc oxide 20 % ointment Apply 1 application topically as needed for irritation.  . [DISCONTINUED] alendronate (FOSAMAX) 35 MG tablet Take 35 mg by mouth every 7 (seven) days. Take with a full glass of water on an empty stomach.   . [DISCONTINUED] pantoprazole (PROTONIX) 40 MG tablet Take 1 tablet (40 mg total) by mouth 2 (two) times daily.  . [DISCONTINUED] QUEtiapine (SEROQUEL) 50 MG tablet Take 50 mg by mouth at bedtime.       Allergies:   Codeine and Latex   Social History   Social History  . Marital status: Single    Spouse name: N/A  . Number of children: N/A  . Years of education: N/A   Occupational History  . Retired     Investment banker, operational   Social History Main Topics  . Smoking status: Former Smoker    Quit date: 07/14/1996  . Smokeless tobacco: Never Used  . Alcohol use No  . Drug use: No  . Sexual activity:  Not Asked   Other Topics Concern  . None   Social History Narrative   Sister Leda Gauze gideon is POA     Family History:  The patient's family history includes Breast cancer in her mother; Heart failure in her mother; Liver cancer in her father; Stroke in her mother.   ROS:   Please see the history of present illness.    Review of Systems  Reason unable to perform ROS: Alzheimers dementia.   All other systems reviewed and are negative.   PHYSICAL EXAM:   VS:  BP 100/60   Pulse 73   Ht 5\' 8"  (1.727 m)   Wt 166 lb 12.8 oz (75.7 kg)   BMI  25.36 kg/m   Physical Exam  GEN: Well nourished, well developed, in no acute distress  HEENT: normal  Neck: no JVD, carotid bruits, or masses Cardiac:RRR; no murmurs, rubs, or gallops  Respiratory:  Decreased breath sounds with scattered wheezes GI: soft, nontender, nondistended, + BS Ext: Legs are wrapped with trace of edema Decreased distal pulses bilaterally Neuro:  Dementia Psych: Friendly  Wt Readings from Last 3 Encounters:  03/30/17 166 lb 12.8 oz (75.7 kg)  01/03/17 173 lb (78.5 kg)  03/04/11 190 lb (86.2 kg)      Studies/Labs Reviewed:   EKG:  EKG is  ordered today.  The ekg ordered today demonstrates Normal sinus rhythm with right bundle branch block  Recent Labs: 01/02/2017: B Natriuretic Peptide 155.3 01/04/2017: BUN 13; Creatinine, Ser 0.98; Hemoglobin 11.3; Platelets 163; Potassium 4.1; Sodium 139   Lipid Panel No results found for: CHOL, TRIG, HDL, CHOLHDL, VLDL, LDLCALC, LDLDIRECT  Additional studies/ records that were reviewed today include:  2-D echo 6/24/18Study Conclusions   - Left ventricle: The cavity size was normal. Systolic function was   normal. The estimated ejection fraction was in the range of 55%   to 60%. Images were inadequate for LV wall motion assessment.   Doppler parameters are consistent with abnormal left ventricular   relaxation (grade 1 diastolic dysfunction). - Aortic valve: Poorly  visualized. There was no stenosis. There was   trivial regurgitation. - Mitral valve: Poorly visualized. - Left atrium: Poorly visualized. - Right ventricle: Poorly visualized. - Right atrium: Poorly visualized. - Tricuspid valve: Poorly visualized. - Pulmonary arteries: No complete TR doppler jet so unable to   estimate PA systolic pressure. - Inferior vena cava: The vessel was normal in size. The   respirophasic diameter changes were in the normal range (>= 50%),   consistent with normal central venous pressure.   Impressions:   - Very poor quality study.   CT scan 6/22/18IMPRESSION: 1. No CT evidence of central pulmonary artery embolus. 2. 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 There is consolidative changes of the left lung base which may represent postobstructive or compressive atelectasis. Infiltrate is not excluded. Clinical correlation is recommended. 3. Small left pleural effusion.     Electronically Signed   By: Anner Crete M.D.   On: 01/02/2017 23:17     ASSESSMENT:    1. Chronic diastolic congestive heart failure ([place])   2. Alzheimer's dementia without behavioral disturbance, unspecified timing of dementia onset   3. Right bundle branch block      PLAN:  In order of problems listed above:   Chronic diastolic CHF 2-D echo 09/2200 grade 1 DD. Normal LV function. Poor quality test. Patient's heart failure is compensated. She is maintained on Lasix 20 mg once daily. Recommend 2 g sodium diet, daily weights, extra Lasix 20 mg if weight gain of 2 or 3 pounds overnight, keep legs elevated and wrapped during the day, periodic bmet to check potassium and renal function. Discuss with Dr. Radford Pax who concurs. Follow-up with cardiology when necessary.  [facility] nursing facility.  Right bundle branch block asymptomatic, no further work up indicated   Medication Adjustments/Labs  and Tests Ordered: Current medicines are reviewed at length with the patient today.  Concerns regarding medicines are outlined above.  Medication changes, Labs and Tests ordered today are listed in the Patient Instructions below. Patient Instructions  Medication Instructions:  Your physician recommends that you  continue on your current medications as directed. Please refer to the Current Medication list given to you today.  You should monitor your weight daily.  If you gain 2-3 pounds overnight, you may take an extra 20 mg of furosemide (lasix).  Labwork: None ordered  The facility should periodically check a BMET (at the discretion of the facility).  Testing/Procedures: None ordered  Follow-Up:  Your physician recommends that you schedule a follow-up AS NEEDED if symptoms worsen or fail to improve.  Any Other Special Instructions Will Be Listed Below (If Applicable).  Please limit your sodium intake to 2 grams daily.   DASH Eating Plan DASH stands for "Dietary Approaches to Stop Hypertension." The DASH eating plan is a healthy eating plan that has been shown to reduce high blood pressure (hypertension). It may also reduce your risk for type 2 diabetes, heart disease, and stroke. The DASH eating plan may also help with weight loss. What are tips for following this plan? General guidelines  Avoid eating more than 2,000 mg (milligrams) of salt (sodium) a day. If you have hypertension, you may need to reduce your sodium intake to 1,500 mg a day.  Limit alcohol intake to no more than 1 drink a day for nonpregnant women and 2 drinks a day for men. One drink equals 12 oz of beer, 5 oz of wine, or 1 oz of hard liquor.  Work with your health care provider to maintain a healthy body weight or to lose weight. Ask what an ideal weight is for you.  Get at least 30 minutes of exercise that causes your heart to beat faster (aerobic exercise) most days of the week. Activities may include  walking, swimming, or biking.  Work with your health care provider or diet and nutrition specialist (dietitian) to adjust your eating plan to your individual calorie needs. Reading food labels  Check food labels for the amount of sodium per serving. Choose foods with less than 5 percent of the Daily Value of sodium. Generally, foods with less than 300 mg of sodium per serving fit into this eating plan.  To find whole grains, look for the word "whole" as the first word in the ingredient list. Shopping  Buy products labeled as "low-sodium" or "no salt added."  Buy fresh foods. Avoid canned foods and premade or frozen meals. Cooking  Avoid adding salt when cooking. Use salt-free seasonings or herbs instead of table salt or sea salt. Check with your health care provider or pharmacist before using salt substitutes.  Do not fry foods. Cook foods using healthy methods such as baking, boiling, grilling, and broiling instead.  Cook with heart-healthy oils, such as olive, canola, soybean, or sunflower oil. Meal planning   Eat a balanced diet that includes: ? 5 or more servings of fruits and vegetables each day. At each meal, try to fill half of your plate with fruits and vegetables. ? Up to 6-8 servings of whole grains each day. ? Less than 6 oz of lean meat, poultry, or fish each day. A 3-oz serving of meat is about the same size as a deck of cards. One egg equals 1 oz. ? 2 servings of low-fat dairy each day. ? A serving of nuts, seeds, or beans 5 times each week. ? Heart-healthy fats. Healthy fats called Omega-3 fatty acids are found in foods such as flaxseeds and coldwater fish, like sardines, salmon, and mackerel.  Limit how much you eat of the following: ? Canned or prepackaged foods. ?  Food that is high in trans fat, such as fried foods. ? Food that is high in saturated fat, such as fatty meat. ? Sweets, desserts, sugary drinks, and other foods with added sugar. ? Full-fat dairy  products.  Do not salt foods before eating.  Try to eat at least 2 vegetarian meals each week.  Eat more home-cooked food and less restaurant, buffet, and fast food.  When eating at a restaurant, ask that your food be prepared with less salt or no salt, if possible. What foods are recommended? The items listed may not be a complete list. Talk with your dietitian about what dietary choices are best for you. Grains Whole-grain or whole-wheat bread. Whole-grain or whole-wheat pasta. Brown rice. Modena Morrow. Bulgur. Whole-grain and low-sodium cereals. Pita bread. Low-fat, low-sodium crackers. Whole-wheat flour tortillas. Vegetables Fresh or frozen vegetables (raw, steamed, roasted, or grilled). Low-sodium or reduced-sodium tomato and vegetable juice. Low-sodium or reduced-sodium tomato sauce and tomato paste. Low-sodium or reduced-sodium canned vegetables. Fruits All fresh, dried, or frozen fruit. Canned fruit in natural juice (without added sugar). Meat and other protein foods Skinless chicken or [place]. Ground chicken or [place]. Pork with fat trimmed off. Fish and seafood. Egg whites. Dried beans, peas, or lentils. Unsalted nuts, nut butters, and seeds. Unsalted canned beans. Lean cuts of beef with fat trimmed off. Low-sodium, lean deli meat. Dairy Low-fat (1%) or fat-free (skim) milk. Fat-free, low-fat, or reduced-fat cheeses. Nonfat, low-sodium ricotta or cottage cheese. Low-fat or nonfat yogurt. Low-fat, low-sodium cheese. Fats and oils Soft margarine without trans fats. Vegetable oil. Low-fat, reduced-fat, or light mayonnaise and salad dressings (reduced-sodium). Canola, safflower, olive, soybean, and sunflower oils. Avocado. Seasoning and other foods Herbs. Spices. Seasoning mixes without salt. Unsalted popcorn and pretzels. Fat-free sweets. What foods are not recommended? The items listed may not be a complete list. Talk with your dietitian about what dietary choices are best for  you. Grains Baked goods made with fat, such as croissants, muffins, or some breads. Dry pasta or rice meal packs. Vegetables Creamed or fried vegetables. Vegetables in a cheese sauce. Regular canned vegetables (not low-sodium or reduced-sodium). Regular canned tomato sauce and paste (not low-sodium or reduced-sodium). Regular tomato and vegetable juice (not low-sodium or reduced-sodium). Angie Fava. Olives. Fruits Canned fruit in a light or heavy syrup. Fried fruit. Fruit in cream or butter sauce. Meat and other protein foods Fatty cuts of meat. Ribs. Fried meat. Berniece Salines. Sausage. Bologna and other processed lunch meats. Salami. Fatback. Hotdogs. Bratwurst. Salted nuts and seeds. Canned beans with added salt. Canned or smoked fish. Whole eggs or egg yolks. Chicken or [place] with skin. Dairy Whole or 2% milk, cream, and half-and-half. Whole or full-fat cream cheese. Whole-fat or sweetened yogurt. Full-fat cheese. Nondairy creamers. Whipped toppings. Processed cheese and cheese spreads. Fats and oils Butter. Stick margarine. Lard. Shortening. Ghee. Bacon fat. Tropical oils, such as coconut, palm kernel, or palm oil. Seasoning and other foods Salted popcorn and pretzels. Onion salt, garlic salt, seasoned salt, table salt, and sea salt. Worcestershire sauce. Tartar sauce. Barbecue sauce. Teriyaki sauce. Soy sauce, including reduced-sodium. Steak sauce. Canned and packaged gravies. Fish sauce. Oyster sauce. Cocktail sauce. Horseradish that you find on the shelf. Ketchup. Mustard. Meat flavorings and tenderizers. Bouillon cubes. Hot sauce and Tabasco sauce. Premade or packaged marinades. Premade or packaged taco seasonings. Relishes. Regular salad dressings. Where to find more information:  National Heart, Lung, and [place]: https://wilson-eaton.com/  American Heart Association: www.heart.org Summary  The DASH eating plan is  a healthy eating plan that has been shown to reduce high blood pressure  (hypertension). It may also reduce your risk for type 2 diabetes, heart disease, and stroke.  With the DASH eating plan, you should limit salt (sodium) intake to 2,300 mg a day. If you have hypertension, you may need to reduce your sodium intake to 1,500 mg a day.  When on the DASH eating plan, aim to eat more fresh fruits and vegetables, whole grains, lean proteins, low-fat dairy, and heart-healthy fats.  Work with your health care provider or diet and nutrition specialist (dietitian) to adjust your eating plan to your individual calorie needs. This information is not intended to replace advice given to you by your health care provider. Make sure you discuss any questions you have with your health care provider. Document Released: 06/19/2011 Document Revised: 06/23/2016 Document Reviewed: 06/23/2016 Elsevier Interactive Patient Education  2017 [employer].   If you need a refill on your cardiac medications before your next appointment, please call your pharmacy.      Sumner Boast, PA-C  03/30/2017 10:02 AM    [facility] [place], [place], [place]  [address] Phone: [phone]; Fax: [phone]

## 2017-03-30 NOTE — Patient Instructions (Addendum)
Medication Instructions:  Your physician recommends that you continue on your current medications as directed. Please refer to the Current Medication list given to you today.  You should monitor your weight daily.  If you gain 2-3 pounds overnight, you may take an extra 20 mg of furosemide (lasix).  Labwork: None ordered  The facility should periodically check a BMET (at the discretion of the facility).  Testing/Procedures: None ordered  Follow-Up:  Your physician recommends that you schedule a follow-up AS NEEDED if symptoms worsen or fail to improve.  Any Other Special Instructions Will Be Listed Below (If Applicable).  Please limit your sodium intake to 2 grams daily.   DASH Eating Plan DASH stands for "Dietary Approaches to Stop Hypertension." The DASH eating plan is a healthy eating plan that has been shown to reduce high blood pressure (hypertension). It may also reduce your risk for type 2 diabetes, heart disease, and stroke. The DASH eating plan may also help with weight loss. What are tips for following this plan? General guidelines  Avoid eating more than 2,000 mg (milligrams) of salt (sodium) a day. If you have hypertension, you may need to reduce your sodium intake to 1,500 mg a day.  Limit alcohol intake to no more than 1 drink a day for nonpregnant women and 2 drinks a day for men. One drink equals 12 oz of beer, 5 oz of wine, or 1 oz of hard liquor.  Work with your health care provider to maintain a healthy body weight or to lose weight. Ask what an ideal weight is for you.  Get at least 30 minutes of exercise that causes your heart to beat faster (aerobic exercise) most days of the week. Activities may include walking, swimming, or biking.  Work with your health care provider or diet and nutrition specialist (dietitian) to adjust your eating plan to your individual calorie needs. Reading food labels  Check food labels for the amount of sodium per serving.  Choose foods with less than 5 percent of the Daily Value of sodium. Generally, foods with less than 300 mg of sodium per serving fit into this eating plan.  To find whole grains, look for the word "whole" as the first word in the ingredient list. Shopping  Buy products labeled as "low-sodium" or "no salt added."  Buy fresh foods. Avoid canned foods and premade or frozen meals. Cooking  Avoid adding salt when cooking. Use salt-free seasonings or herbs instead of table salt or sea salt. Check with your health care provider or pharmacist before using salt substitutes.  Do not fry foods. Cook foods using healthy methods such as baking, boiling, grilling, and broiling instead.  Cook with heart-healthy oils, such as olive, canola, soybean, or sunflower oil. Meal planning   Eat a balanced diet that includes: ? 5 or more servings of fruits and vegetables each day. At each meal, try to fill half of your plate with fruits and vegetables. ? Up to 6-8 servings of whole grains each day. ? Less than 6 oz of lean meat, poultry, or fish each day. A 3-oz serving of meat is about the same size as a deck of cards. One egg equals 1 oz. ? 2 servings of low-fat dairy each day. ? A serving of nuts, seeds, or beans 5 times each week. ? Heart-healthy fats. Healthy fats called Omega-3 fatty acids are found in foods such as flaxseeds and coldwater fish, like sardines, salmon, and mackerel.  Limit how much you eat of  the following: ? Canned or prepackaged foods. ? Food that is high in trans fat, such as fried foods. ? Food that is high in saturated fat, such as fatty meat. ? Sweets, desserts, sugary drinks, and other foods with added sugar. ? Full-fat dairy products.  Do not salt foods before eating.  Try to eat at least 2 vegetarian meals each week.  Eat more home-cooked food and less restaurant, buffet, and fast food.  When eating at a restaurant, ask that your food be prepared with less salt or no  salt, if possible. What foods are recommended? The items listed may not be a complete list. Talk with your dietitian about what dietary choices are best for you. Grains Whole-grain or whole-wheat bread. Whole-grain or whole-wheat pasta. Brown rice. [place]. Bulgur. Whole-grain and low-sodium cereals. Pita bread. Low-fat, low-sodium crackers. Whole-wheat flour tortillas. Vegetables Fresh or frozen vegetables (raw, steamed, roasted, or grilled). Low-sodium or reduced-sodium tomato and vegetable juice. Low-sodium or reduced-sodium tomato sauce and tomato paste. Low-sodium or reduced-sodium canned vegetables. Fruits All fresh, dried, or frozen fruit. Canned fruit in natural juice (without added sugar). Meat and other protein foods Skinless chicken or [place]. Ground chicken or [place]. Pork with fat trimmed off. Fish and seafood. Egg whites. Dried beans, peas, or lentils. Unsalted nuts, nut butters, and seeds. Unsalted canned beans. Lean cuts of beef with fat trimmed off. Low-sodium, lean deli meat. Dairy Low-fat (1%) or fat-free (skim) milk. Fat-free, low-fat, or reduced-fat cheeses. Nonfat, low-sodium ricotta or cottage cheese. Low-fat or nonfat yogurt. Low-fat, low-sodium cheese. Fats and oils Soft margarine without trans fats. Vegetable oil. Low-fat, reduced-fat, or light mayonnaise and salad dressings (reduced-sodium). Canola, safflower, olive, soybean, and sunflower oils. Avocado. Seasoning and other foods Herbs. Spices. Seasoning mixes without salt. Unsalted popcorn and pretzels. Fat-free sweets. What foods are not recommended? The items listed may not be a complete list. Talk with your dietitian about what dietary choices are best for you. Grains Baked goods made with fat, such as croissants, muffins, or some breads. Dry pasta or rice meal packs. Vegetables Creamed or fried vegetables. Vegetables in a cheese sauce. Regular canned vegetables (not low-sodium or reduced-sodium). Regular  canned tomato sauce and paste (not low-sodium or reduced-sodium). Regular tomato and vegetable juice (not low-sodium or reduced-sodium). Angie Fava. Olives. Fruits Canned fruit in a light or heavy syrup. Fried fruit. Fruit in cream or butter sauce. Meat and other protein foods Fatty cuts of meat. Ribs. Fried meat. Berniece Salines. Sausage. Bologna and other processed lunch meats. Salami. Fatback. Hotdogs. Bratwurst. Salted nuts and seeds. Canned beans with added salt. Canned or smoked fish. Whole eggs or egg yolks. Chicken or [place] with skin. Dairy Whole or 2% milk, cream, and half-and-half. Whole or full-fat cream cheese. Whole-fat or sweetened yogurt. Full-fat cheese. Nondairy creamers. Whipped toppings. Processed cheese and cheese spreads. Fats and oils Butter. Stick margarine. Lard. Shortening. Ghee. Bacon fat. Tropical oils, such as coconut, palm kernel, or palm oil. Seasoning and other foods Salted popcorn and pretzels. Onion salt, garlic salt, seasoned salt, table salt, and sea salt. Worcestershire sauce. Tartar sauce. Barbecue sauce. Teriyaki sauce. Soy sauce, including reduced-sodium. Steak sauce. Canned and packaged gravies. Fish sauce. Oyster sauce. Cocktail sauce. Horseradish that you find on the shelf. Ketchup. Mustard. Meat flavorings and tenderizers. Bouillon cubes. Hot sauce and Tabasco sauce. Premade or packaged marinades. Premade or packaged taco seasonings. Relishes. Regular salad dressings. Where to find more information:  National Heart, Lung, and [place]: https://wilson-eaton.com/  American Heart Association:  www.heart.org Summary  The DASH eating plan is a healthy eating plan that has been shown to reduce high blood pressure (hypertension). It may also reduce your risk for type 2 diabetes, heart disease, and stroke.  With the DASH eating plan, you should limit salt (sodium) intake to 2,300 mg a day. If you have hypertension, you may need to reduce your sodium intake to 1,500 mg a  day.  When on the DASH eating plan, aim to eat more fresh fruits and vegetables, whole grains, lean proteins, low-fat dairy, and heart-healthy fats.  Work with your health care provider or diet and nutrition specialist (dietitian) to adjust your eating plan to your individual calorie needs. This information is not intended to replace advice given to you by your health care provider. Make sure you discuss any questions you have with your health care provider. Document Released: 06/19/2011 Document Revised: 06/23/2016 Document Reviewed: 06/23/2016 Elsevier Interactive Patient Education  2017 [employer].   If you need a refill on your cardiac medications before your next appointment, please call your pharmacy.

## 2017-08-02 IMAGING — CT CT ANGIO CHEST
2 of 11 series · 18 of 46 positions shown · IV contrast (isovue)
Comparison: Chest radiograph dated 01/02/2017

CLINICAL DATA: 68-year-old female with shortness of breath.

EXAM:
CT ANGIOGRAPHY CHEST WITH CONTRAST
TECHNIQUE: Multidetector CT imaging of the chest was performed using the
standard protocol during bolus administration of intravenous
contrast. Multiplanar CT image reconstructions and MIPs were
obtained to evaluate the vascular anatomy.
CONTRAST:  100 cc Isovue 370

[Series 9: thins · axial · 0.73mm/px · z∈[+194,+464]mm · 15 of 301 slices shown]
[im 16/301  lung]
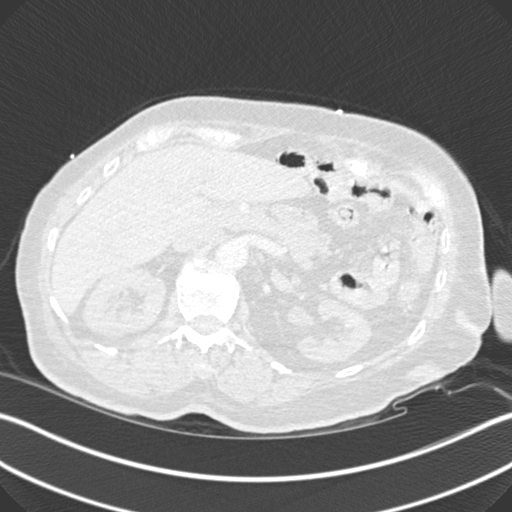
[im 31/301  soft-tissue]
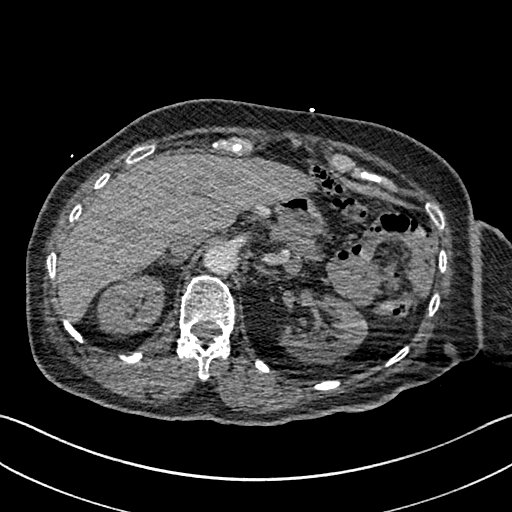
[im 61/301  lung]
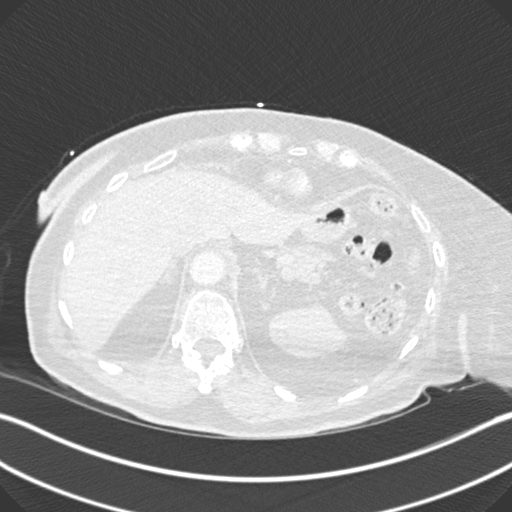
[im 76/301  soft-tissue]
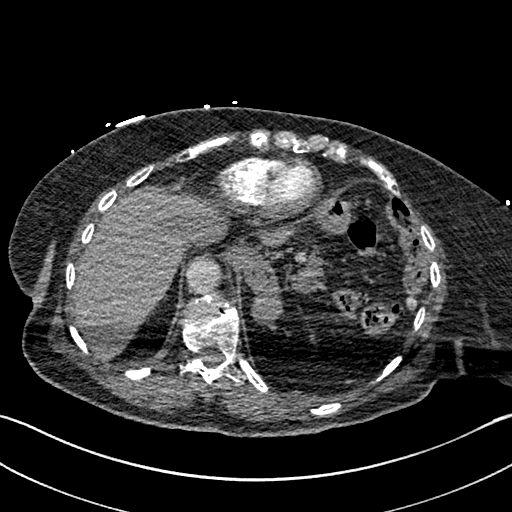
[im 91/301  lung]
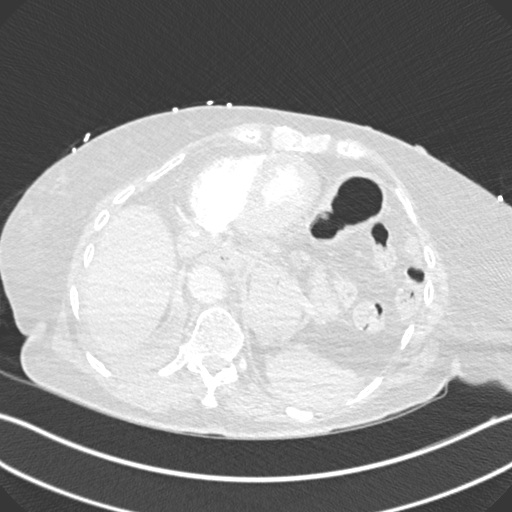
[im 106/301  soft-tissue]
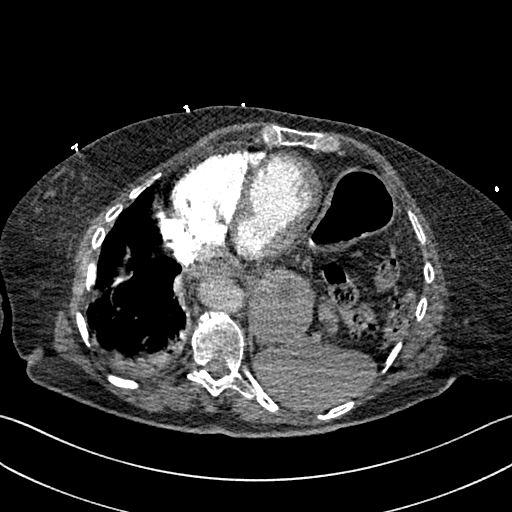
[im 136/301  lung]
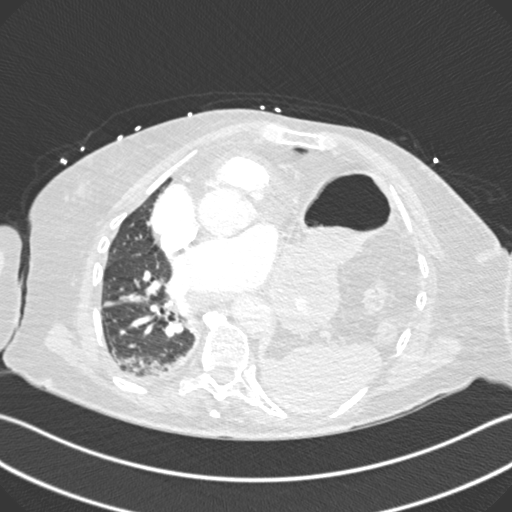
[im 151/301  soft-tissue]
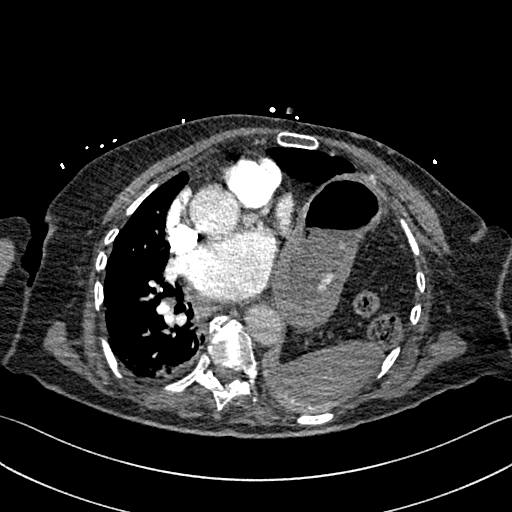
[im 166/301  lung]
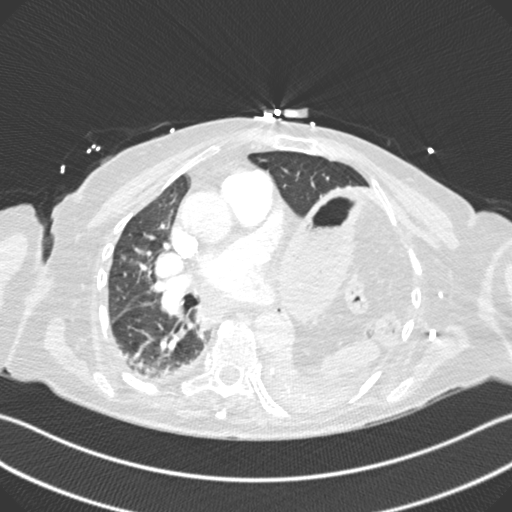
[im 196/301  soft-tissue]
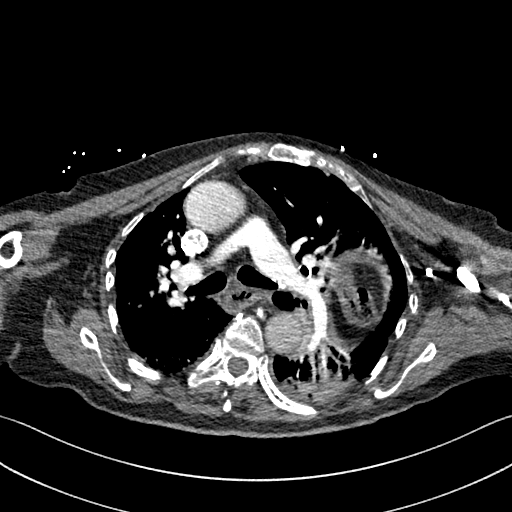
[im 211/301  lung]
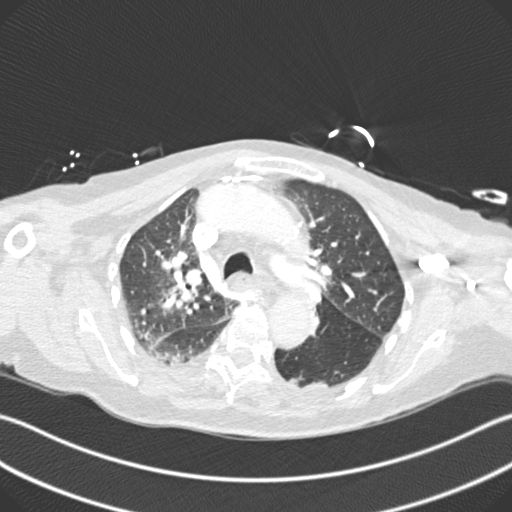
[im 226/301  soft-tissue]
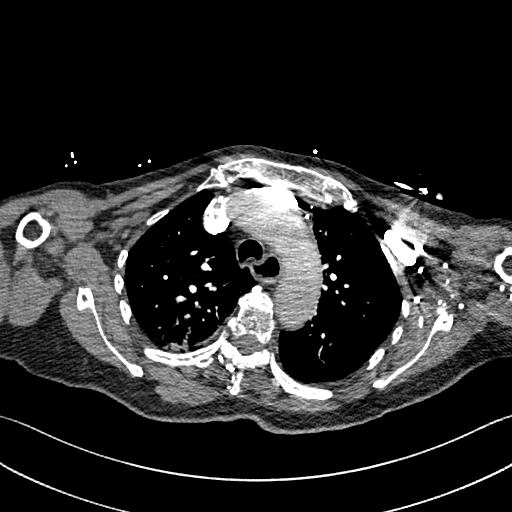
[im 241/301  lung]
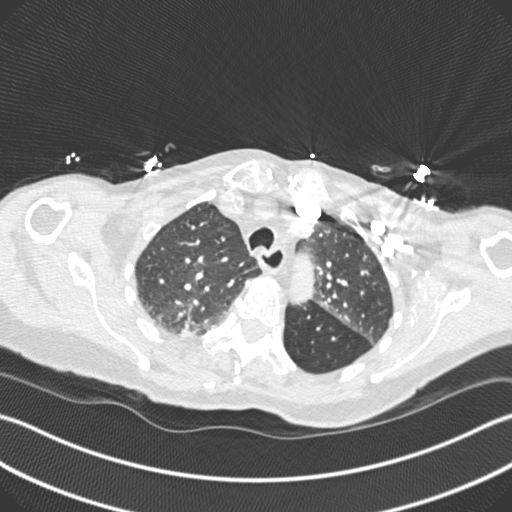
[im 271/301  soft-tissue]
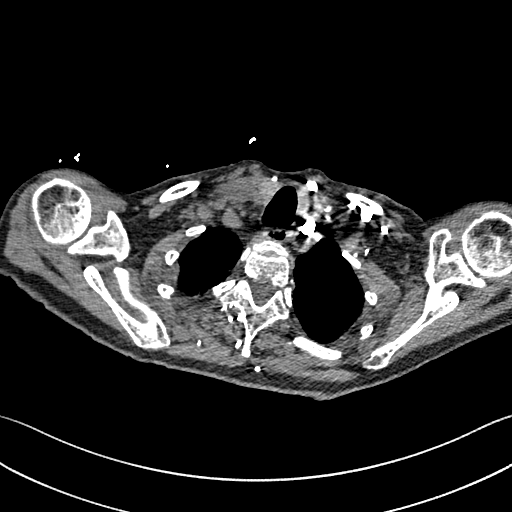
[im 286/301  lung]
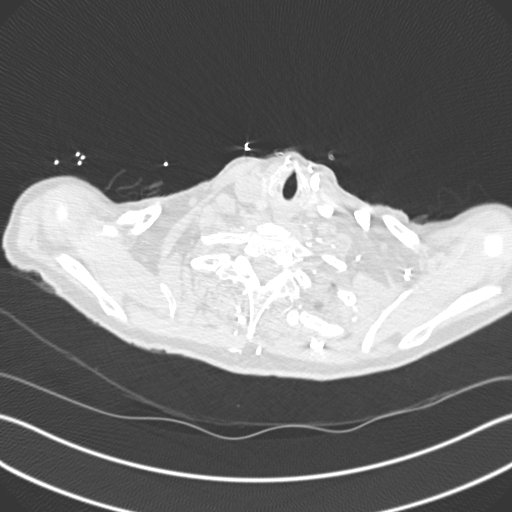

[Series 11: coronal mpr · coronal · 0.59mm/px · 3 of 122 slices shown]
[im 31/122  soft-tissue]
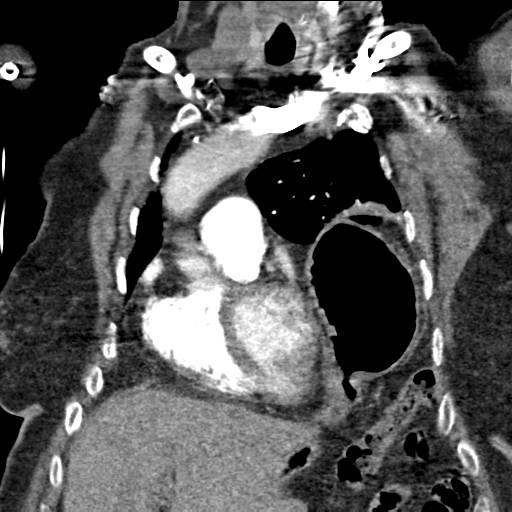
[im 61/122  soft-tissue]
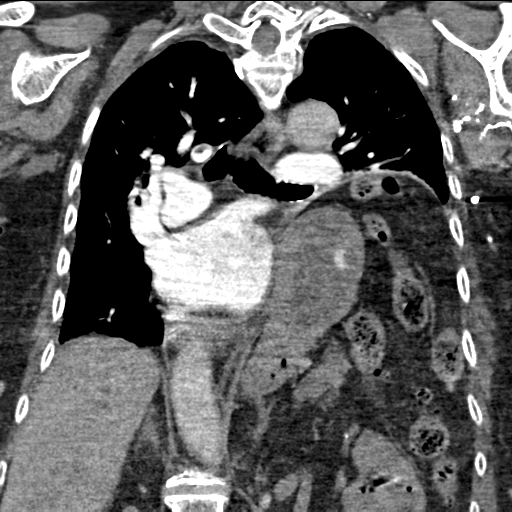
[im 91/122  soft-tissue]
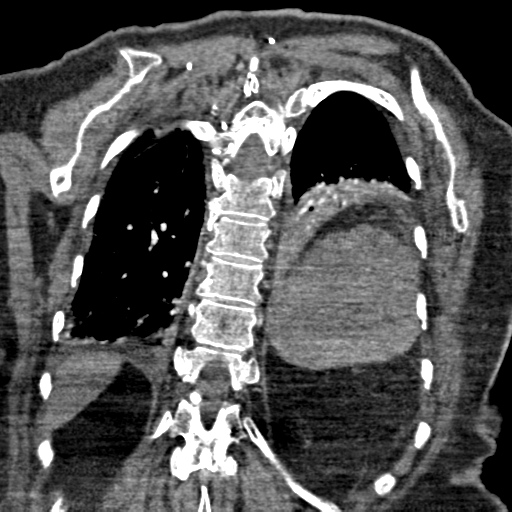

[18 of 46 positions shown; findings below may reference images not displayed]

FINDINGS: Evaluation of the study is limited due to streak artifact caused by
patient's arms.

Cardiovascular: There is no cardiomegaly or pericardial effusion.
The thoracic aorta is unremarkable. Evaluation of the pulmonary
arteries is limited due to streak artifact caused by patient's arms
and suboptimal opacification of the peripheral branches. No central
pulmonary artery embolus identified.

Mediastinum/Nodes: There is no hilar or mediastinal adenopathy.
Esophagus is grossly unremarkable. No mediastinal fluid collection
or hematoma.

Lungs/Pleura: There is severe eventration of the left hemidiaphragm.
There is consolidative changes of the left lung base, which may
represent compressive atelectasis or infiltrate. Clinical
correlation is recommended. Right lung base atelectatic
changes/scarring noted. There is a small left pleural effusion. No
pneumothorax. The central airways are patent. There is compression
of the left lower lobe bronchus by the mass effect caused by
elevated left hemidiaphragm and abdominal content.

Upper Abdomen: There is colonic diverticulosis. The visualized upper
abdomen is otherwise unremarkable.

Musculoskeletal: There is degenerative changes of the spine. No
acute fracture.

Review of the MIP images confirms the above findings.
IMPRESSION: 1. No CT evidence of central pulmonary artery embolus.
2. Severe eventration of the left hemidiaphragm with mass effect and
compression of the left lower lobe bronchus. There is consolidative
changes of the left lung base which may represent postobstructive or
compressive atelectasis. Infiltrate is not excluded. Clinical
correlation is recommended.
3. Small left pleural effusion.

## 2019-02-27 ENCOUNTER — Emergency Department (HOSPITAL_COMMUNITY)
Admission: EM | Admit: 2019-02-27 | Discharge: 2019-02-28 | Disposition: A | Discharge status: Home / Self Care | Payer: Medicare Other | Attending: Emergency Medicine | Admitting: Emergency Medicine

## 2019-02-27 ENCOUNTER — Emergency Department (HOSPITAL_COMMUNITY): Payer: Medicare Other

## 2019-02-27 ENCOUNTER — Encounter (HOSPITAL_COMMUNITY): Payer: Self-pay | Admitting: Emergency Medicine

## 2019-02-27 DIAGNOSIS — R531 Weakness: Secondary | ICD-10-CM | POA: Diagnosis present

## 2019-02-27 DIAGNOSIS — I5032 Chronic diastolic (congestive) heart failure: Secondary | ICD-10-CM | POA: Diagnosis not present

## 2019-02-27 DIAGNOSIS — E860 Dehydration: Secondary | ICD-10-CM | POA: Insufficient documentation

## 2019-02-27 DIAGNOSIS — N390 Urinary tract infection, site not specified: Secondary | ICD-10-CM

## 2019-02-27 DIAGNOSIS — N289 Disorder of kidney and ureter, unspecified: Secondary | ICD-10-CM

## 2019-02-27 DIAGNOSIS — Z96642 Presence of left artificial hip joint: Secondary | ICD-10-CM | POA: Insufficient documentation

## 2019-02-27 DIAGNOSIS — Z87891 Personal history of nicotine dependence: Secondary | ICD-10-CM | POA: Insufficient documentation

## 2019-02-27 DIAGNOSIS — F0390 Unspecified dementia without behavioral disturbance: Secondary | ICD-10-CM | POA: Diagnosis not present

## 2019-02-27 DIAGNOSIS — Z20828 Contact with and (suspected) exposure to other viral communicable diseases: Secondary | ICD-10-CM | POA: Diagnosis not present

## 2019-02-27 DIAGNOSIS — E039 Hypothyroidism, unspecified: Secondary | ICD-10-CM | POA: Insufficient documentation

## 2019-02-27 MED ORDER — SODIUM CHLORIDE 0.9 % IV BOLUS
1000.0000 mL | Freq: Once | INTRAVENOUS | Status: AC
  Administered 2019-02-28: 01:00:00 1000 mL via INTRAVENOUS

## 2019-02-27 NOTE — ED Triage Notes (Signed)
Pt comes from [facility], pt has dementia at baseline but staff says she has not been eating and drinking and is orthostatic issues, related to weakness.  Pt is a little more aggressive with staff which not her normal behavior.  V/s on arrival are 99/54, spo2 95, cbg 142, temp 97.7, hr 96.  DNR but no paperwork w ems.

## 2019-02-28 DIAGNOSIS — E860 Dehydration: Secondary | ICD-10-CM | POA: Diagnosis not present

## 2019-02-28 LAB — COMPREHENSIVE METABOLIC PANEL
ALT: 10 U/L (ref 0–44)
AST: 10 U/L — ABNORMAL LOW (ref 15–41)
Albumin: 3.3 g/dL — ABNORMAL LOW (ref 3.5–5.0)
Alkaline Phosphatase: 58 U/L (ref 38–126)
Anion gap: 13 (ref 5–15)
BUN: 31 mg/dL — ABNORMAL HIGH (ref 8–23)
CO2: 31 mmol/L (ref 22–32)
Calcium: 8.6 mg/dL — ABNORMAL LOW (ref 8.9–10.3)
Chloride: 100 mmol/L (ref 98–111)
Creatinine, Ser: 1.52 mg/dL — ABNORMAL HIGH (ref 0.44–1.00)
GFR calc Af Amer: 37 mL/min — ABNORMAL LOW (ref >60)
GFR calc non Af Amer: 32 mL/min — ABNORMAL LOW (ref >60)
Glucose, Bld: 122 mg/dL — ABNORMAL HIGH (ref 70–99)
Potassium: 3.4 mmol/L — ABNORMAL LOW (ref 3.5–5.1)
Sodium: 144 mmol/L (ref 135–145)
Total Bilirubin: 1.3 mg/dL — ABNORMAL HIGH (ref 0.3–1.2)
Total Protein: 7.1 g/dL (ref 6.5–8.1)

## 2019-02-28 LAB — CBC WITH DIFFERENTIAL/PLATELET
Abs Immature Granulocytes: 0.04 10*3/uL (ref 0.00–0.07)
Basophils Absolute: 0 10*3/uL (ref 0.0–0.1)
Basophils Relative: 0 %
Eosinophils Absolute: 0 10*3/uL (ref 0.0–0.5)
Eosinophils Relative: 0 %
HCT: 43.6 % (ref 36.0–46.0)
Hemoglobin: 13.6 g/dL (ref 12.0–15.0)
Immature Granulocytes: 0 %
Lymphocytes Relative: 15 %
Lymphs Abs: 1.4 10*3/uL (ref 0.7–4.0)
MCH: 29.2 pg (ref 26.0–34.0)
MCHC: 31.2 g/dL (ref 30.0–36.0)
MCV: 93.6 fL (ref 80.0–100.0)
Monocytes Absolute: 0.8 10*3/uL (ref 0.1–1.0)
Monocytes Relative: 9 %
Neutro Abs: 6.7 10*3/uL (ref 1.7–7.7)
Neutrophils Relative %: 76 %
Platelets: 154 10*3/uL (ref 150–400)
RBC: 4.66 MIL/uL (ref 3.87–5.11)
RDW: 16.2 % — ABNORMAL HIGH (ref 11.5–15.5)
WBC: 8.9 10*3/uL (ref 4.0–10.5)
nRBC: 0 % (ref 0.0–0.2)

## 2019-02-28 LAB — SARS CORONAVIRUS 2 BY RT PCR (HOSPITAL ORDER, PERFORMED IN ~~LOC~~ HOSPITAL LAB): SARS Coronavirus 2: NEGATIVE

## 2019-02-28 LAB — URINALYSIS, ROUTINE W REFLEX MICROSCOPIC
Bacteria, UA: NONE SEEN
Bilirubin Urine: NEGATIVE
Glucose, UA: NEGATIVE mg/dL
Hgb urine dipstick: NEGATIVE
Ketones, ur: 5 mg/dL — AB
Nitrite: NEGATIVE
Protein, ur: NEGATIVE mg/dL
Specific Gravity, Urine: 1.024 (ref 1.005–1.030)
pH: 7 (ref 5.0–8.0)

## 2019-02-28 LAB — VALPROIC ACID LEVEL: Valproic Acid Lvl: 27 ug/mL — ABNORMAL LOW (ref 50.0–100.0)

## 2019-02-28 LAB — TSH: TSH: 2.021 u[IU]/mL (ref 0.350–4.500)

## 2019-02-28 MED ORDER — SODIUM CHLORIDE 0.9 % IV BOLUS
1000.0000 mL | Freq: Once | INTRAVENOUS | Status: AC
  Administered 2019-02-28: 1000 mL via INTRAVENOUS

## 2019-02-28 MED ORDER — SODIUM CHLORIDE 0.9 % IV SOLN
1.0000 g | Freq: Once | INTRAVENOUS | Status: AC
  Administered 2019-02-28: 1 g via INTRAVENOUS
  Filled 2019-02-28: qty 10

## 2019-02-28 MED ORDER — CEPHALEXIN 250 MG/5ML PO SUSR: 500.0000 mg | Freq: Three times a day (TID) | ORAL | 0 refills | Status: AC

## 2019-02-28 NOTE — ED Provider Notes (Signed)
[facility] DEPT Provider Note   CSN: [id number] Arrival date & time: 02/27/19  2212  Time seen 11:28 PM  History   Chief Complaint Chief Complaint  Patient presents with  . Weakness   Level 5 caveat for dementia  HPI Crystal Braun is a 78 y.o. female.     HPI history is obtained by patient's sister.  Sister states patient did not take her medicine today because she refused to take her medicine.  She has done this before and sister states she is "stubborn".  She also did not eat today and she ate less than normal yesterday.  Sister estimates she has lost 10 pounds in the past 1 to 2 weeks.  She states she was seen by her doctor last week and was felt to be congested.  She had a chest x-ray done which showed "fluid in the lungs".  And she was treated for pneumonia.  Sister also states she is drinking less fluids.  Sister has been in the room of her prior to me coming in and states she has not noted any coughing.  She has not been informed of any fever.  She states she was told that all of her sisters COVID test have been negative.  She states patient has been in nursing facilities for a long time.  She was wheelchair-bound for several years however she did go to a facility where the physical therapist did get her up and using a walker.  When I review her medication list sent from the nursing home patient is on dexamethasone that was started August 8 and Levaquin also started on August 10.  Her Lasix was stopped.  When her sister calls the nursing home they do not know why she was started on the dexamethasone and they cannot tell her when her last COVID test was.  Patient states she feels fine and her tongue is not dry.  She states she is eating and taking her medicine.  However sister states this is not true.  [facility]   Past Medical History:  Diagnosis Date  . Acute lower UTI 01/02/2017  . Acute respiratory failure with hypoxia  ([place]) 01/02/2017  . Alzheimer disease ([place])   . Alzheimer's disease ([place]) 05/06/2010   Qualifier: Diagnosis of  By: Nelson-Smith CMA (AAMA), Dottie    . Anemia, unspecified   . Broken femur ([place]) 2009   2 broken bones below knee, ([place])  . Complete rectal prolapse with no displacement of anal muscles 02/11/2011  . Diverticulosis of colon (without mention of hemorrhage)   . Esophageal reflux   . Hyperlipidemia   . HYPOTHYROIDISM 07/16/2010   Qualifier: Diagnosis of  By: Tamala Julian CMA, Claiborne Billings    . Incontinence   . Major depressive disorder, recurrent episode, moderate ([place])   . NONSPECIFIC ABN FINDING RAD & OTH EXAM GI TRACT 07/17/2010   Qualifier: Diagnosis of  By: Bobby Rumpf CMA ([place]), Patty    . Obsessive-compulsive disorder   . Obsessive-compulsive disorders   . Osteoporosis   . Personal history of colonic polyps   . Pneumonia   . Schizophrenia ([place]) 05/06/2010   Qualifier: Diagnosis of  By: Nelson-Smith CMA (AAMA), Dottie    . Special screening for malignant neoplasms, colon   . Thyroid disease    hypothroidism  . Unspecified schizophrenia, unspecified condition   . Weakness generalized 01/02/2017  . Weight gain     Patient Active Problem List   Diagnosis Date Noted  .  Chronic diastolic CHF (congestive heart failure) ([place]) 03/30/2017  . Right bundle branch block 03/30/2017  . Acute respiratory failure with hypoxia ([place]) 01/02/2017  . Acute lower UTI 01/02/2017  . Weakness generalized 01/02/2017  . Obsessive-compulsive disorder   . Pre-operative cardiovascular examination 03/04/2011  . Complete rectal prolapse with no displacement of anal muscles 02/11/2011  . NONSPECIFIC ABN FINDING RAD & OTH EXAM GI TRACT 07/17/2010  . HYPOTHYROIDISM 07/16/2010  . DIVERTICULAR DISEASE 07/16/2010  . COLONIC POLYPS, HX OF 07/16/2010  . UNSPECIFIED ANEMIA 05/06/2010  . Schizophrenia ([place]) 05/06/2010  . MAJOR DPRSV DISORDER RECURRENT EPISODE MODERATE 05/06/2010  . OBSESSION 05/06/2010  . Alzheimer's  disease ([place]) 05/06/2010  . GERD 05/06/2010  . OSTEOPOROSIS 05/06/2010    Past Surgical History:  Procedure Laterality Date  . cervix excision     [place] electrical surgical excision of the cervix  . EYE SURGERY  12/2010   cataract  . HEMIARTHROPLASTY HIP     Left [place]  . JOINT REPLACEMENT  2007 or 2008   left hip  . ORIF FEMUR FRACTURE- LISS PLATE       OB History   No obstetric history on file.      Home Medications    Prior to Admission medications   Medication Sig Start Date End Date Taking? Authorizing Provider  acetaminophen (TYLENOL) 325 MG tablet Take 650 mg by mouth every 6 (six) hours as needed for mild pain, moderate pain, fever or headache.   Yes [provider]  Calcium Carb-Cholecalciferol (CALCIUM-VITAMIN D) 600-400 MG-UNIT TABS Take 1 tablet by mouth daily.   Yes [provider]  Cholecalciferol (VITAMIN D PO) Take 2,000 Units by mouth daily.    Yes [provider]  Cranberry 250 MG CAPS Take 250 mg by mouth 2 (two) times daily.    Yes [provider]  divalproex (DEPAKOTE) 250 MG DR tablet Take 250 mg by mouth 2 (two) times daily.   Yes [provider]  furosemide (LASIX) 40 MG tablet Take 40 mg by mouth daily.   Yes [provider]  loperamide (IMODIUM) 2 MG capsule Take 2 mg by mouth 3 (three) times daily as needed for diarrhea or loose stools.   Yes [provider]  LORazepam (ATIVAN) 0.5 MG tablet Take 0.5 mg by mouth 3 (three) times daily as needed for anxiety.   Yes [provider]  Melatonin 3 MG TABS Take 6 mg by mouth at bedtime.    Yes [provider]  memantine (NAMENDA) 10 MG tablet Take 10 mg by mouth daily.     Yes [provider]  midodrine (PROAMATINE) 2.5 MG tablet Take 2.5 mg by mouth 3 (three) times daily with meals.   Yes [provider]  nitrofurantoin (MACRODANTIN) 50 MG capsule Take 50 mg by mouth at bedtime.   Yes [provider]  potassium chloride SA (K-DUR) 20 MEQ tablet Take 20 mEq by mouth 2 (two) times daily.   Yes [provider]  sertraline (ZOLOFT) 50 MG tablet Take 75 mg by mouth daily.    Yes [provider]  zinc oxide 20 % ointment Apply 1 application topically as needed for irritation.   Yes [provider]  cephALEXin (KEFLEX) 250 MG/5ML suspension Take 10 mLs (500 mg total) by mouth 3 (three) times daily for 7 days. 02/28/19 03/07/19  Rolland Porter, MD    Family History Family History  Problem Relation Age of Onset  . Stroke Mother   .  Breast cancer Mother   . Heart failure Mother   . Liver cancer Father     Social History Social History   Tobacco Use  . Smoking status: Former Smoker    Quit date: 07/14/1996    Years since quitting: 22.6  . Smokeless tobacco: Never Used  Substance Use Topics  . Alcohol use: No  . Drug use: No  Lives in a facility   Allergies   Codeine and Latex   Review of Systems Review of Systems  Unable to perform ROS: Dementia     Physical Exam Updated Vital Signs BP 102/68   Pulse 93   Temp 97.8 F (36.6 C) (Oral)   Resp (!) 22   SpO2 100%   Physical Exam Vitals signs and nursing note reviewed.  Constitutional:      Comments: Thin elderly female  HENT:     Head: Normocephalic and atraumatic.     Right Ear: External ear normal.     Left Ear: External ear normal.     Nose: Nose normal.     Mouth/Throat:     Mouth: Mucous membranes are dry.     Comments: Tongue is extremely dry Eyes:     Extraocular Movements: Extraocular movements intact.     Conjunctiva/sclera: Conjunctivae normal.     Pupils: Pupils are equal, round, and reactive to light.     Comments: Patient right eyelid is kept close although she states she can see out of both eyes.  Cardiovascular:     Rate and Rhythm: Normal rate and regular rhythm.  Pulmonary:     Effort: Pulmonary effort is normal. No respiratory distress.     Breath sounds:  Normal breath sounds.  Abdominal:     General: Abdomen is flat. Bowel sounds are normal. There is no distension.     Palpations: Abdomen is soft.  Musculoskeletal:     Comments: Patient has a large bruised area behind her right knee and some yellowish discoloration of the anterior knee and proximal lower leg on the right.  Patient denies any pain.  Skin:    General: Skin is warm and dry.     Capillary Refill: Capillary refill takes less than 2 seconds.     Findings: Bruising present.  Neurological:     Mental Status: She is alert.     Comments: Awake and alert, follows simple commands  Psychiatric:        Mood and Affect: Affect is flat.        ED Treatments / Results  Labs (all labs ordered are listed, but only abnormal results are displayed) Results for orders placed or performed during the hospital encounter of 02/27/19  SARS Coronavirus 2 [facility] order, Performed in [facility] hospital lab) Nasopharyngeal Nasopharyngeal Swab   Specimen: Nasopharyngeal Swab  Result Value Ref Range   SARS Coronavirus 2 NEGATIVE NEGATIVE  Comprehensive metabolic panel  Result Value Ref Range   Sodium 144 135 - 145 mmol/L   Potassium 3.4 (L) 3.5 - 5.1 mmol/L   Chloride 100 98 - 111 mmol/L   CO2 31 22 - 32 mmol/L   Glucose, Bld 122 (H) 70 - 99 mg/dL   BUN 31 (H) 8 - 23 mg/dL   Creatinine, Ser 1.52 (H) 0.44 - 1.00 mg/dL   Calcium 8.6 (L) 8.9 - 10.3 mg/dL   Total Protein 7.1 6.5 - 8.1 g/dL   Albumin 3.3 (L) 3.5 - 5.0 g/dL   AST 10 (L) 15 - 41 U/L  ALT 10 0 - 44 U/L   Alkaline Phosphatase 58 38 - 126 U/L   Total Bilirubin 1.3 (H) 0.3 - 1.2 mg/dL   GFR calc non Af Amer 32 (L) >60 mL/min   GFR calc Af Amer 37 (L) >60 mL/min   Anion gap 13 5 - 15  CBC with Differential  Result Value Ref Range   WBC 8.9 4.0 - 10.5 K/uL   RBC 4.66 3.87 - 5.11 MIL/uL   Hemoglobin 13.6 12.0 - 15.0 g/dL   HCT 43.6 36.0 - 46.0 %   MCV 93.6 80.0 - 100.0 fL   MCH 29.2 26.0 - 34.0 pg   MCHC 31.2 30.0 -  36.0 g/dL   RDW 16.2 (H) 11.5 - 15.5 %   Platelets 154 150 - 400 K/uL   nRBC 0.0 0.0 - 0.2 %   Neutrophils Relative % 76 %   Neutro Abs 6.7 1.7 - 7.7 K/uL   Lymphocytes Relative 15 %   Lymphs Abs 1.4 0.7 - 4.0 K/uL   Monocytes Relative 9 %   Monocytes Absolute 0.8 0.1 - 1.0 K/uL   Eosinophils Relative 0 %   Eosinophils Absolute 0.0 0.0 - 0.5 K/uL   Basophils Relative 0 %   Basophils Absolute 0.0 0.0 - 0.1 K/uL   Immature Granulocytes 0 %   Abs Immature Granulocytes 0.04 0.00 - 0.07 K/uL  Urinalysis, Routine w reflex microscopic  Result Value Ref Range   Color, Urine YELLOW YELLOW   APPearance CLEAR CLEAR   Specific Gravity, Urine 1.024 1.005 - 1.030   pH 7.0 5.0 - 8.0   Glucose, UA NEGATIVE NEGATIVE mg/dL   Hgb urine dipstick NEGATIVE NEGATIVE   Bilirubin Urine NEGATIVE NEGATIVE   Ketones, ur 5 (A) NEGATIVE mg/dL   Protein, ur NEGATIVE NEGATIVE mg/dL   Nitrite NEGATIVE NEGATIVE   Leukocytes,Ua SMALL (A) NEGATIVE   RBC / HPF 6-10 0 - 5 RBC/hpf   WBC, UA 21-50 0 - 5 WBC/hpf   Bacteria, UA NONE SEEN NONE SEEN   Mucus PRESENT    Hyaline Casts, UA PRESENT   Valproic acid level  Result Value Ref Range   Valproic Acid Lvl 27 (L) 50.0 - 100.0 ug/mL  TSH  Result Value Ref Range   TSH 2.021 0.350 - 4.500 uIU/mL    Laboratory interpretation all normal except possible UTI   EKG None  Radiology Dg Chest Port 1 View  Result Date: 02/28/2019 CLINICAL DATA:  Weakness, orthostasis, increasing aggression EXAM: PORTABLE CHEST 1 VIEW COMPARISON:  Radiograph January 02, 2017, CT a chest January 02, 2017 FINDINGS: Chronic elevation of the left hemidiaphragm with adjacent passive atelectasis. Coarse interstitial changes in the right lung base are similar to prior with stable trace right effusion. No new consolidative opacity is seen. No pneumothorax. No acute osseous or soft tissue abnormality. Cardiomediastinal contours are unchanged from previous studies. IMPRESSION: No definite acute  cardiopulmonary abnormality. Stable elevation left hemidiaphragm with passive atelectasis. Chronic coarse interstitial changes and trace right effusion, similar to priors. Electronically Signed   By: Lovena Le M.D.   On: 02/28/2019 00:07    Procedures Procedures (including critical care time)  Medications Ordered in ED Medications  cefTRIAXone (ROCEPHIN) 1 g in sodium chloride 0.9 % 100 mL IVPB (1 g Intravenous New Bag/Given 02/28/19 0342)  sodium chloride 0.9 % bolus 1,000 mL (0 mLs Intravenous Stopped 02/28/19 0203)  sodium chloride 0.9 % bolus 1,000 mL (1,000 mLs Intravenous New Bag/Given 02/28/19 0341)  Initial Impression / Assessment and Plan / ED Course  I have reviewed the triage vital signs and the nursing notes.  Pertinent labs & imaging results that were available during my care of the patient were reviewed by me and considered in my medical decision making (see chart for details).      Patient appeared to be dehydrated.  She was given IV fluids.  Review of patient's urine is suspicious for UTI.  She had been on Levaquin already for several days which should have treated a UTI.  She was given ceftriaxone IV.  Urine culture was sent.  An additional liter of fluid was given due to her renal insufficiency.  3:20 AM I spoke to the patient's sister.  We discussed her test results.  She states patient used to get a lot of UTIs.  We are going to see if patient will drink some fluids.  At this point I do not feel patient needs to be admitted to the hospital.  Hopefully we have hydrated her and she will start drinking on her own.  Final Clinical Impressions(s) / ED Diagnoses   Final diagnoses:  Dehydration  Renal insufficiency  Urinary tract infection without hematuria, site unspecified    ED Discharge Orders         Ordered    cephALEXin (KEFLEX) 250 MG/5ML suspension  3 times daily     02/28/19 0359         Plan discharge  Rolland Porter, MD, Barbette Or, MD  02/28/19 [phone]

## 2019-02-28 NOTE — Discharge Instructions (Addendum)
Her laboratory test today look slike mild dehydration.  She was given IV fluids.  She may have a mild urinary tract infection that was partially treated by the Levaquin.  Please give her the cephalexin until gone.  Try to encourage her to drink more fluids throughout the day.  Her COVID test was negative tonight.  Her chest x-ray shows her pneumonia has resolved.

## 2019-02-28 NOTE — ED Notes (Signed)
Called ptar for ride to [place] facility to give report  Paper work complete

## 2019-03-01 LAB — URINE CULTURE
Culture: NO GROWTH
Special Requests: NORMAL

## 2019-07-15 DEATH — deceased
